# Patient Record
Sex: Male | Born: 1985 | Race: Black or African American | Hispanic: No | Marital: Married | Smoking: Never smoker
Health system: Southern US, Community
[De-identification: ages and names within clinical notes are randomized; demographics above are authoritative.]

## PROBLEM LIST (undated history)

## (undated) DIAGNOSIS — G43909 Migraine, unspecified, not intractable, without status migrainosus: Secondary | ICD-10-CM

## (undated) DIAGNOSIS — M5030 Other cervical disc degeneration, unspecified cervical region: Secondary | ICD-10-CM

## (undated) DIAGNOSIS — B009 Herpesviral infection, unspecified: Secondary | ICD-10-CM

## (undated) HISTORY — DX: Other cervical disc degeneration, unspecified cervical region: M50.30

## (undated) HISTORY — DX: Herpesviral infection, unspecified: B00.9

## (undated) HISTORY — DX: Migraine, unspecified, not intractable, without status migrainosus: G43.909

## (undated) HISTORY — PX: NO PAST SURGERIES: SHX2092

---

## 2006-08-20 ENCOUNTER — Emergency Department (HOSPITAL_COMMUNITY): Admission: EM | Admit: 2006-08-20 | Discharge: 2006-08-20 | Payer: Self-pay | Admitting: Emergency Medicine

## 2006-08-28 ENCOUNTER — Emergency Department (HOSPITAL_COMMUNITY): Admission: EM | Admit: 2006-08-28 | Discharge: 2006-08-28 | Payer: Self-pay | Admitting: Emergency Medicine

## 2012-09-25 ENCOUNTER — Emergency Department (HOSPITAL_COMMUNITY)
Admission: EM | Admit: 2012-09-25 | Discharge: 2012-09-25 | Disposition: A | Discharge status: Home / Self Care | Payer: BC Managed Care – PPO | Attending: Emergency Medicine | Admitting: Emergency Medicine

## 2012-09-25 ENCOUNTER — Encounter (HOSPITAL_COMMUNITY): Payer: Self-pay

## 2012-09-25 ENCOUNTER — Emergency Department (HOSPITAL_COMMUNITY): Payer: BC Managed Care – PPO

## 2012-09-25 DIAGNOSIS — Y9301 Activity, walking, marching and hiking: Secondary | ICD-10-CM | POA: Insufficient documentation

## 2012-09-25 DIAGNOSIS — S82899A Other fracture of unspecified lower leg, initial encounter for closed fracture: Secondary | ICD-10-CM | POA: Insufficient documentation

## 2012-09-25 DIAGNOSIS — Y9289 Other specified places as the place of occurrence of the external cause: Secondary | ICD-10-CM | POA: Insufficient documentation

## 2012-09-25 DIAGNOSIS — X500XXA Overexertion from strenuous movement or load, initial encounter: Secondary | ICD-10-CM | POA: Insufficient documentation

## 2012-09-25 DIAGNOSIS — S82892A Other fracture of left lower leg, initial encounter for closed fracture: Secondary | ICD-10-CM

## 2012-09-25 MED ORDER — MELOXICAM 15 MG PO TABS: 15.0000 mg | ORAL_TABLET | Freq: Every day | ORAL | Status: DC

## 2012-09-25 NOTE — ED Notes (Signed)
Pt c/o lt ankle pain/swelling, states rolled it when walking out the door at 12:55 today

## 2012-09-25 NOTE — ED Provider Notes (Signed)
CSN: [id number]     Arrival date & time 09/25/12  1605 History  This chart was scribed for non-physician practitioner Arthor Captain, PA-C, working with Ross Marcus, MD by Dorothey Baseman, ED Scribe. This patient was seen in room WTR6/WTR6 and the patient's care was started at 5:29 PM.    Chief Complaint  Patient presents with  . Ankle Pain   Patient is a 27 y.o. male presenting with ankle pain. The history is provided by the patient. No language interpreter was used.  Ankle Pain Location:  Ankle Ankle location:  L ankle Pain details:    Radiates to:  Does not radiate   Severity:  Moderate   Onset quality:  Sudden   Timing:  Constant   Progression:  Improving Chronicity:  New Prior injury to area:  No Relieved by:  NSAIDs and ice Worsened by:  Bearing weight Associated symptoms: decreased ROM and swelling    HPI Comments: Adam Colon is a 27 y.o. male who presents to the Emergency Department complaining of constant left ankle pain, 3-4/10 currently that has improved slightly since the incident, with associated swelling onset 4.5 hours ago when he states that he rolled the ankle when walking out of his house. He reports a limited range of motion due to pain and that the pain is exacerbated with walking and bearing weight. He states that he has taken ibuprofen at home and applied ice with mild, temporary relief. He reports a history of similar injury to the right ankle, but denies any prior injury to the left ankle. He denies knee pain.   History reviewed. No pertinent past medical history. History reviewed. No pertinent past surgical history. No family history on file. History  Substance Use Topics  . Smoking status: Never Smoker   . Smokeless tobacco: Not on file  . Alcohol Use: No    Review of Systems  Musculoskeletal: Positive for joint swelling and gait problem.  Skin: Negative for wound.  Neurological: Negative for weakness and numbness.  Hematological: Does not  bruise/bleed easily.  All other systems reviewed and are negative.    Allergies  Review of patient's allergies indicates no known allergies.  Home Medications   Current Outpatient Rx  Name  Route  Sig  Dispense  Refill  . ibuprofen (ADVIL,MOTRIN) 200 MG tablet   Oral   Take 200 mg by mouth every 6 (six) hours as needed for pain.          Triage Vitals: BP 117/76  Pulse 71  Temp(Src) 99.1 F (37.3 C) (Oral)  Resp 18  SpO2 100%  Physical Exam  Nursing note and vitals reviewed. Constitutional: He is oriented to person, place, and time. He appears well-developed and well-nourished. No distress.  HENT:  Head: Normocephalic and atraumatic.  Eyes: Conjunctivae are normal.  Neck: Normal range of motion. Neck supple.  Musculoskeletal: Normal range of motion. He exhibits edema and tenderness.  Significant swelling to the left, lateral malleolus.   Neurological: He is alert and oriented to person, place, and time.  Skin: Skin is warm and dry.  Psychiatric: He has a normal mood and affect. His behavior is normal.    ED Course  Procedures (including critical care time)  DIAGNOSTIC STUDIES: Oxygen Saturation is 100% on room air, normal by my interpretation.    COORDINATION OF CARE: 5:35PM- Will order an x-ray of the left ankle. Discussed treatment plan with patient at bedside and patient verbalized agreement.     Labs Review Labs Reviewed -  No data to display Imaging Review Dg Ankle Complete Left  09/25/2012   CLINICAL DATA:  Twisted ankle today. Pain, swelling.  EXAM: LEFT ANKLE COMPLETE - 3+ VIEW  COMPARISON:  None.  FINDINGS: There is significant lateral soft tissue swelling. Small bone densities are seen adjacent to the medial and lateral malleolar 1. Small bone density is identified posterior to the joint space. Large joint effusion is present.  IMPRESSION: 1. Small avulsion fractures adjacent to medial, lateral, and possibly posterior malleoli. 2. Significant soft  tissue swelling and joint effusion.   Electronically Signed   By: Rosalie Gums M.D.   On: 09/25/2012 18:13    MDM   1. Avulsion fracture of ankle, left, closed, initial encounter   Patient with very small avulsion fractures of the malleoli BL. Joint feels stable on exam. The patient will be discharged with Cam walker, crutches and ortho follow up. Supportive care and RICE encouraged,  I personally performed the services described in this documentation, which was scribed in my presence. The recorded information has been reviewed and is accurate.     Arthor Captain, PA-C 09/27/12 1745

## 2012-09-27 NOTE — ED Provider Notes (Signed)
Medical screening examination/treatment/procedure(s) were performed by non-physician practitioner and as supervising physician I was immediately available for consultation/collaboration.  Shon Baton, MD 09/27/12 [phone]

## 2013-12-19 ENCOUNTER — Encounter (HOSPITAL_COMMUNITY): Payer: Self-pay | Admitting: Emergency Medicine

## 2013-12-19 ENCOUNTER — Emergency Department (HOSPITAL_COMMUNITY)
Admission: EM | Admit: 2013-12-19 | Discharge: 2013-12-19 | Disposition: A | Discharge status: Home / Self Care | Payer: Medicaid Other | Attending: Emergency Medicine | Admitting: Emergency Medicine

## 2013-12-19 DIAGNOSIS — Z791 Long term (current) use of non-steroidal anti-inflammatories (NSAID): Secondary | ICD-10-CM | POA: Diagnosis not present

## 2013-12-19 DIAGNOSIS — J029 Acute pharyngitis, unspecified: Secondary | ICD-10-CM | POA: Insufficient documentation

## 2013-12-19 LAB — RAPID STREP SCREEN (MED CTR MEBANE ONLY): STREPTOCOCCUS, GROUP A SCREEN (DIRECT): NEGATIVE

## 2013-12-19 MED ORDER — IBUPROFEN 600 MG PO TABS: 600.0000 mg | ORAL_TABLET | Freq: Four times a day (QID) | ORAL | Status: DC | PRN

## 2013-12-19 MED ORDER — LORATADINE 10 MG PO TABS: 10.0000 mg | ORAL_TABLET | Freq: Every day | ORAL | Status: DC

## 2013-12-19 NOTE — ED Provider Notes (Signed)
CSN: [id number]     Arrival date & time 12/19/13  0929 History  This chart was scribed for non-physician practitioner, Jinny SandersJoseph Makenzey Nanni, PA-C, working with Lyanne CoKevin M Campos, MD by Charline BillsEssence Howell, ED Scribe. This patient was seen in room TR06C/TR06C and the patient's care was started at 9:39 AM.   Chief Complaint  Patient presents with  . Sore Throat   The history is provided by the patient. No language interpreter was used.   HPI Comments: Adam Colon is a 28 y.o. male who presents to the Emergency Department complaining of mild sore throat upon waking this morning. He currently rates his pain 2/10. Pt reports associated HA, fever, itchy throat, fatigue onset this morning. Tmax 100.4 F, ED temperature 98.6 F. He denies cough. Pt reports that his coworker was recently diagnosed with strep throat. Pt has been treating with ibuprofen with relief of his symptoms.   History reviewed. No pertinent past medical history. History reviewed. No pertinent past surgical history. No family history on file. History  Substance Use Topics  . Smoking status: Never Smoker   . Smokeless tobacco: Not on file  . Alcohol Use: No    Review of Systems  Constitutional: Positive for fever and fatigue.  HENT: Positive for sore throat.   Respiratory: Negative for cough.   Neurological: Positive for headaches.   Allergies  Review of patient's allergies indicates no known allergies.  Home Medications   Prior to Admission medications   Medication Sig Start Date End Date Taking? Authorizing Provider  ibuprofen (ADVIL,MOTRIN) 600 MG tablet Take 1 tablet (600 mg total) by mouth every 6 (six) hours as needed. 12/19/13   Monte FantasiaJoseph W Latangela Mccomas, PA-C  loratadine (CLARITIN) 10 MG tablet Take 1 tablet (10 mg total) by mouth daily. One po daily x 5 days 12/19/13   Monte FantasiaJoseph W Emarie Paul, PA-C  meloxicam (MOBIC) 15 MG tablet Take 1 tablet (15 mg total) by mouth daily. 09/25/12   Arthor CaptainAbigail Harris, PA-C   Triage Vitals: BP 136/70 mmHg  Pulse 90   Temp(Src) 98.6 F (37 C) (Oral)  Resp 18  Ht 6\' 1"  (1.854 m)  Wt 192 lb (87.091 kg)  BMI 25.34 kg/m2  SpO2 97% Physical Exam  Constitutional: He is oriented to person, place, and time. He appears well-developed and well-nourished. No distress.  HENT:  Head: Normocephalic and atraumatic.  Mouth/Throat: Uvula is midline and mucous membranes are normal. No trismus in the jaw. No dental abscesses or uvula swelling. Posterior oropharyngeal erythema (mild) present. No oropharyngeal exudate, posterior oropharyngeal edema or tonsillar abscesses.  No tonsillar swelling. Tonsils non-enlarged bilaterally.   Eyes: Conjunctivae and EOM are normal.  Neck: Neck supple. No tracheal deviation present.  Cardiovascular: Normal rate.   Pulmonary/Chest: Effort normal. No respiratory distress.  Musculoskeletal: Normal range of motion.  Lymphadenopathy:    He has no cervical adenopathy.  Neurological: He is alert and oriented to person, place, and time.  Skin: Skin is warm and dry.  Psychiatric: He has a normal mood and affect. His behavior is normal.  Nursing note and vitals reviewed.  ED Course  Procedures (including critical care time) DIAGNOSTIC STUDIES: Oxygen Saturation is 97% on RA, normal by my interpretation.    COORDINATION OF CARE: 9:42 AM-Discussed treatment plan which includes strep screen and culture with pt at bedside and pt agreed to plan.   Labs Review Labs Reviewed  RAPID STREP SCREEN  CULTURE, GROUP A STREP   Imaging Review No results found.   EKG Interpretation None  MDM   Final diagnoses:  Viral pharyngitis    Pt afebrile without tonsillar exudate, negative strep. Presents with 1 day of generalized malaise, low-grade fever, mild headache. Symptoms relieved with ibuprofen. Patient stating he has had similar signs and symptoms with strep throat in the past, also had a recent sick contact of a coworker who had positive strep test. Patient without cervical  lymphadenopathy, or dysphagia. Patient with a "itchy throat"; diagnosis of viral pharyngitis. No abx indicated. DC w symptomatic tx for pain  Pt does not appear dehydrated, but did discuss importance of water rehydration. Presentation non concerning for PTA or infxn spread to soft tissue. No trismus or uvula deviation. No neck stiffness or meningeal signs. No [facility] or [facility]. Specific return precautions discussed. Pt able to drink water in ED without difficulty with intact air way. Recommended PCP follow up. Provided resource guide to help patient find a PCP. Patient agreeable to this plan. I encouraged patient to call or return to the ER should he have any questions or concerns.  I personally performed the services described in this documentation, which was scribed in my presence. The recorded information has been reviewed and is accurate.  BP 136/70 mmHg  Pulse 90  Temp(Src) 98.6 F (37 C) (Oral)  Resp 18  Ht 6\' 1"  (1.854 m)  Wt 192 lb (87.091 kg)  BMI 25.34 kg/m2  SpO2 97%  Signed,  Ladona MowJoe Derrian Rodak, PA-C 5:11 PM    Monte FantasiaJoseph W Lulubelle Simcoe, PA-C 12/19/13 1711  Lyanne CoKevin M Campos, MD 12/20/13 [phone]

## 2013-12-19 NOTE — ED Notes (Signed)
Patient states he was exposed to strep throat by co-worker and a child where he works also.   Patient states "I had it before and now I have similar symptoms".  Patient states took ibuprofen this morning for pain 400mg .

## 2013-12-19 NOTE — Discharge Instructions (Signed)
Pharyngitis °Pharyngitis is redness, pain, and swelling (inflammation) of your pharynx.  °CAUSES  °Pharyngitis is usually caused by infection. Most of the time, these infections are from viruses (viral) and are part of a cold. However, sometimes pharyngitis is caused by bacteria (bacterial). Pharyngitis can also be caused by allergies. Viral pharyngitis may be spread from person to person by coughing, sneezing, and personal items or utensils (cups, forks, spoons, toothbrushes). Bacterial pharyngitis may be spread from person to person by more intimate contact, such as kissing.  °SIGNS AND SYMPTOMS  °Symptoms of pharyngitis include:   °· Sore throat.   °· Tiredness (fatigue).   °· Low-grade fever.   °· Headache. °· Joint pain and muscle aches. °· Skin rashes. °· Swollen lymph nodes. °· Plaque-like film on throat or tonsils (often seen with bacterial pharyngitis). °DIAGNOSIS  °Your health care provider will ask you questions about your illness and your symptoms. Your medical history, along with a physical exam, is often all that is needed to diagnose pharyngitis. Sometimes, a rapid strep test is done. Other lab tests may also be done, depending on the suspected cause.  °TREATMENT  °Viral pharyngitis will usually get better in 3-4 days without the use of medicine. Bacterial pharyngitis is treated with medicines that kill germs (antibiotics).  °HOME CARE INSTRUCTIONS  °· Drink enough water and fluids to keep your urine clear or pale yellow.   °· Only take over-the-counter or prescription medicines as directed by your health care provider:   °¨ If you are prescribed antibiotics, make sure you finish them even if you start to feel better.   °¨ Do not take aspirin.   °· Get lots of rest.   °· Gargle with 8 oz of salt water (½ tsp of salt per 1 qt of water) as often as every 1-2 hours to soothe your throat.   °· Throat lozenges (if you are not at risk for choking) or sprays may be used to soothe your throat. °SEEK MEDICAL  CARE IF:  °· You have large, tender lumps in your neck. °· You have a rash. °· You cough up green, yellow-brown, or bloody spit. °SEEK IMMEDIATE MEDICAL CARE IF:  °· Your neck becomes stiff. °· You drool or are unable to swallow liquids. °· You vomit or are unable to keep medicines or liquids down. °· You have severe pain that does not go away with the use of recommended medicines. °· You have trouble breathing (not caused by a stuffy nose). °MAKE SURE YOU:  °· Understand these instructions. °· Will watch your condition. °· Will get help right away if you are not doing well or get worse. °Document Released: 12/21/2004 Document Revised: 10/11/2012 Document Reviewed: 08/28/2012 °ExitCare® Patient Information ©2015 [employer]. This information is not intended to replace advice given to you by your health care provider. Make sure you discuss any questions you have with your health care provider. ° ° °Emergency Department Resource Guide °1) Find a Doctor and Pay Out of Pocket °Although you won't have to find out who is covered by your insurance plan, it is a good idea to ask around and get recommendations. You will then need to call the office and see if the doctor you have chosen will accept you as a new patient and what types of options they offer for patients who are self-pay. Some doctors offer discounts or will set up payment plans for their patients who do not have insurance, but you will need to ask so you aren't surprised when you get to your appointment. ° °  2) Contact Your Local Health Department °Not all health departments have doctors that can see patients for sick visits, but many do, so it is worth a call to see if yours does. If you don't know where your local health department is, you can check in your phone book. The CDC also has a tool to help you locate your state's health department, and many state websites also have listings of all of their local health departments. ° °3) Find a Walk-in Clinic °If  your illness is not likely to be very severe or complicated, you may want to try a walk in clinic. These are popping up all over the country in pharmacies, drugstores, and shopping centers. They're usually staffed by nurse practitioners or physician assistants that have been trained to treat common illnesses and complaints. They're usually fairly quick and inexpensive. However, if you have serious medical issues or chronic medical problems, these are probably not your best option. ° °No Primary Care Doctor: °- Call [facility] at  832-8000 - they can help you locate a primary care doctor that  accepts your insurance, provides certain services, etc. °- Physician Referral Service- 1-800-533-3463 ° °Chronic Pain Problems: °Organization         Address  Phone   Notes  °[facility]  (336) 297-2271 Patients need to be referred by their primary care doctor.  ° °Medication Assistance: °Organization         Address  Phone   Notes  °[place] [facility] [address] °[place], [place] [address] (336) 641-8030 --Must be a resident of [place] °-- Must have NO insurance coverage whatsoever (no Medicaid/ Medicare, etc.) °-- The pt. MUST have a primary care doctor that directs their care regularly and follows them in the community °  °[facility]  (866) 331-1348   °[employer]  (888) 892-1162   ° °Agencies that provide inexpensive medical care: °Organization         Address  Phone   Notes  °[facility]  (336) 832-8035   °[facility]    (336) 832-7272   °[facility] [address] °[place], [place] [address] (336) 832-4777   °[facility] [address], °[place] (336) 271-4999   °[facility]    (336) 373-0678   °[facility]    (336) 272-1050   °[facility] ° [address], [place] Phone:  (336) 832-4444, Fax:  (336) 832-4440 Hours of  Operation:  9 am - 6 pm, M-F.  Also accepts Medicaid/Medicare and self-pay.  °[facility] ° [address], [place] Phone: (336) 832-3150, Fax: (336) 832-3151. Hours of Operation:  8:30 am - 5:30 pm, M-F.  Also accepts Medicaid and self-pay.  °[facility] [place] [address], [place] Phone: (336) 878-6027   °[facility] [address], [place],  (336)723-1848, Ext. 123 Mondays & Thursdays: 7-9 AM.  First 15 patients are seen on a first come, first serve basis. °  ° °Medicaid-accepting [place] Providers: ° °Organization         Address  Phone   Notes  °[facility] [address],  (336) 641-2100 Also accepts self-pay patients.  °[facility] [address],  ° (336) 856-9996   °[facility] [address], [place] (336) 288-8857   °[facility] [address] [place] [address], [place] (336) 299-7000   °Veita Bland [address], [place]  ° (336) 373-1557 Only accepts [place] Access Medicaid patients after they have their name applied to their card.  ° °Self-Pay (no insurance) in [place]: ° °Organization         Address  Phone   Notes  °Sickle Cell Patients, [facility] [address], [place] (336) 832-1970   °[facility] [address], [place] (336) 832-4400   °[facility] ° [address], [place] (336) 992-4800   °[facility]/Dr. Osei-Bonsu ° [address] [place] [address], [place] or [address], [place] (336) 841-8500 Phone number for both [place] and [place] locations is the same.  °[facility] [address], [place] (336) 299-0000   °[facility] [address] [place] [address], [place] or [address] (336) 852-7530 °(336) 878-2260   °[facility] [address], [place] (336) 350-1642, phone; (336) 294-5005, fax Sees patients 1st and 3rd Saturday of every month.  Must not qualify for public or private insurance (i.e. Medicaid, Medicare, [place] Health Choice, Veterans' Benefits) • Household income should be no more than 200% of the poverty level •The clinic cannot treat you if you are pregnant or think you are pregnant • Sexually transmitted diseases are not treated at the clinic.  ° ° °Dental Care: °Organization         Address  Phone  Notes  °[facility] [address], [place] (336) 641-6152 Accepts children up to age 21 who are enrolled in Medicaid or [place] Health Choice; pregnant women with a Medicaid card; and children who have applied for Medicaid or [place] Health Choice, but were declined, whose parents can pay a reduced fee at time of service.  °[facility] [place]  [address], [place] (336) 641-7733 Accepts children up to age 21 who are enrolled in Medicaid or [place] Health Choice; pregnant women with a Medicaid card; and children who have applied for Medicaid or [place] Health Choice, but were declined, whose parents can pay a reduced fee at time of service.  °[facility] ° [address],  (336) 641-4533 Patients are seen by appointment only. Walk-ins are not accepted. [facility] will see patients 18 years of age and older. °Monday - Tuesday (8am-5pm) °Most Wednesdays (8:30-5pm) °$30 per visit, cash only  °[facility] ° [address], [place] (336) 641-4533 Patients are seen by appointment only. Walk-ins are not accepted. [facility] will see patients 18 years of age and older. °One Wednesday Evening (Monthly: Volunteer Based).  $30 per visit, cash only  °[facility]  (919) [phone] for adults; Children under age 4, call Graduate Pediatric  Dentistry at ([phone]) [phone]. Children aged 4-14, please call (919) [phone] to request a pediatric application. ° Dental services are provided in all areas of dental care including fillings, crowns and bridges, complete and partial dentures, implants, gum treatment, root canals, and extractions. Preventive care is also provided. Treatment is provided to both adults and children. °Patients are selected via a lottery and there is often a waiting list. °  °[facility] [address], °  [place] ° (336) 763-8833 [email or web address] °  °[facility] [address], [place], [place] (336)723-1848, Ext. 123 Second and Fourth Thursday of each month, opens at 6:30 AM; Clinic ends at 9 AM.  Patients are seen on a first-come first-served basis, and a limited number are seen during each clinic.  ° °[facility] ° [address], [place], [place] (336) 723-7904   Eligibility Requirements °You must have lived in [place], [place], or [place] counties for at least the last three months. °  You cannot be eligible for state or federal sponsored healthcare insurance, including Veterans Administration, Medicaid, or Medicare. °  You generally cannot be eligible for healthcare insurance through your employer.  °  How to apply: °Eligibility screenings are held every Tuesday and Wednesday afternoon from 1:00 pm until 4:00 pm. You do not need an appointment for the interview!  °[facility] [address], [place], [place] 336-631-2330   °[facility]  336-342-8273   °[facility]  336-703-3100   °[facility]  336-570-6415   ° °Behavioral Health Resources in the Community: °Intensive Outpatient Programs °Organization         Address  Phone  Notes  °[facility] [address], [place], [place] 336-878-6098   °[facility] [address], [place], [place] 336-832-9800   °[facility] [address], [place], [place] ° 336-882-2125   °[facility] [address],  °[place], [place] 1-800-853-5163 or 336-641-4981   °Substance Abuse Resources °Organization         Address  Phone  Notes  °[facility]  336-882-2125   °[facility]  336-784-9470   °[facility]  336-285-9073   °[facility]  336-845-3988   °Residential & Outpatient Substance Abuse Program  1-800-659-3381   °Psychological Services °Organization         Address  Phone  Notes  °[facility]  336- 832-9600   °[facility]  336- 378-7881   °[facility] [address], [place] 1-800-853-5163 or 336-641-4981   ° °Mobile Crisis Teams °Organization         Address  Phone  Notes  °[facility], Mobile Crisis Care Unit  1-877-626-1772   °Assertive °[facility] ° [address] [place], [place] 336-834-9664   °Sharon DeEsch [address] °[place] 336-554-5454   ° °Self-Help/Support Groups °Organization         Address  Phone             Notes  °[facility] [place] - variety of support groups  336- 373-1402 Call for more information  °Narcotics Anonymous (NA), [facility] [address], °[place]  2 meetings at this location  ° °Residential Treatment Programs °Organization         Address  Phone  Notes  °[facility] [address],    °[place]  1-866-801-8205   °[facility] ° [address], [place], [place] 704-293-8524   °[facility] [address], [place] 336-845-3988 Admissions: 8am-3pm M-F  °[facility] [address]    °[place], [place] 336-841-1104   °[facility] [address], [place], [place] 336-379-7146   °[facility] [address]  °[place], [place] 336-285-9073   °[facility] - Intensive Outpatient [address],  [place], [place] 336-852-3033   °[facility] [address]  °[place], [place] 1-877-615-2722 or 336-784-9470   °[facility] [address] [place], [place] 336-227-7417 Accepts Medicaid  °[facility] [address]  °[place] 1-800-659-3381 Substance Abuse/Addiction Treatment  ° °[place] Behavioral Health Resources °Organization         Address  Phone  Notes  °[facility]  (888) 581-9988   °Julie Brannon, PhD [address] [place], [place]   (336) 349-5553 or (336) 951-0000   °[facility]   [address] °[place], [place] (336) 349-4454   °[facility] [address], [place], [place] (336) 342-8316 Insurance/Medicaid/sponsorship through [facility]  °[facility] [address]                                    [place], [place] (336) 342-8316 Therapy/tele-psych/case  °[facility] [address]  ° [place], [place] (336) 349-2233    °Dr. Arfeen  (336) 349-4544   °[facility] [place]  [employer] [place] [facility] 1) [address],  °2) [address], [place] °3)  [address], [place] (336) 349-3220 °(336) 342-7768 ° °(336) 342-8140   °[place] [facility] (336) 342-1394 or (336) 342-3537 (After Hours)    ° ° ° ° °

## 2013-12-21 LAB — CULTURE, GROUP A STREP

## 2017-06-14 ENCOUNTER — Other Ambulatory Visit (HOSPITAL_COMMUNITY): Payer: Self-pay | Admitting: Nurse Practitioner

## 2017-06-14 ENCOUNTER — Ambulatory Visit
Admission: RE | Admit: 2017-06-14 | Discharge: 2017-06-14 | Disposition: A | Discharge status: Home / Self Care | Payer: Self-pay | Source: Ambulatory Visit | Attending: Nurse Practitioner | Admitting: Nurse Practitioner

## 2017-06-14 ENCOUNTER — Other Ambulatory Visit: Payer: Self-pay | Admitting: Nurse Practitioner

## 2017-06-14 DIAGNOSIS — K2100 Gastro-esophageal reflux disease with esophagitis, without bleeding: Secondary | ICD-10-CM

## 2017-06-30 ENCOUNTER — Ambulatory Visit (HOSPITAL_COMMUNITY): Payer: Self-pay

## 2018-07-18 DIAGNOSIS — H1032 Unspecified acute conjunctivitis, left eye: Secondary | ICD-10-CM | POA: Diagnosis not present

## 2018-09-05 DIAGNOSIS — H1032 Unspecified acute conjunctivitis, left eye: Secondary | ICD-10-CM | POA: Diagnosis not present

## 2018-10-23 DIAGNOSIS — M50323 Other cervical disc degeneration at C6-C7 level: Secondary | ICD-10-CM | POA: Diagnosis not present

## 2018-10-23 DIAGNOSIS — M9901 Segmental and somatic dysfunction of cervical region: Secondary | ICD-10-CM | POA: Diagnosis not present

## 2018-10-23 DIAGNOSIS — M5384 Other specified dorsopathies, thoracic region: Secondary | ICD-10-CM | POA: Diagnosis not present

## 2018-10-23 DIAGNOSIS — M9902 Segmental and somatic dysfunction of thoracic region: Secondary | ICD-10-CM | POA: Diagnosis not present

## 2018-10-26 DIAGNOSIS — M9902 Segmental and somatic dysfunction of thoracic region: Secondary | ICD-10-CM | POA: Diagnosis not present

## 2018-10-26 DIAGNOSIS — M9903 Segmental and somatic dysfunction of lumbar region: Secondary | ICD-10-CM | POA: Diagnosis not present

## 2018-10-26 DIAGNOSIS — M50323 Other cervical disc degeneration at C6-C7 level: Secondary | ICD-10-CM | POA: Diagnosis not present

## 2018-10-26 DIAGNOSIS — M9901 Segmental and somatic dysfunction of cervical region: Secondary | ICD-10-CM | POA: Diagnosis not present

## 2018-11-07 DIAGNOSIS — M9901 Segmental and somatic dysfunction of cervical region: Secondary | ICD-10-CM | POA: Diagnosis not present

## 2018-11-07 DIAGNOSIS — M50323 Other cervical disc degeneration at C6-C7 level: Secondary | ICD-10-CM | POA: Diagnosis not present

## 2018-11-07 DIAGNOSIS — M9902 Segmental and somatic dysfunction of thoracic region: Secondary | ICD-10-CM | POA: Diagnosis not present

## 2018-11-07 DIAGNOSIS — M9903 Segmental and somatic dysfunction of lumbar region: Secondary | ICD-10-CM | POA: Diagnosis not present

## 2018-11-09 DIAGNOSIS — M9901 Segmental and somatic dysfunction of cervical region: Secondary | ICD-10-CM | POA: Diagnosis not present

## 2018-11-09 DIAGNOSIS — M9902 Segmental and somatic dysfunction of thoracic region: Secondary | ICD-10-CM | POA: Diagnosis not present

## 2018-11-09 DIAGNOSIS — M50323 Other cervical disc degeneration at C6-C7 level: Secondary | ICD-10-CM | POA: Diagnosis not present

## 2018-11-09 DIAGNOSIS — M9903 Segmental and somatic dysfunction of lumbar region: Secondary | ICD-10-CM | POA: Diagnosis not present

## 2018-11-13 DIAGNOSIS — M9902 Segmental and somatic dysfunction of thoracic region: Secondary | ICD-10-CM | POA: Diagnosis not present

## 2018-11-13 DIAGNOSIS — M9903 Segmental and somatic dysfunction of lumbar region: Secondary | ICD-10-CM | POA: Diagnosis not present

## 2018-11-13 DIAGNOSIS — M542 Cervicalgia: Secondary | ICD-10-CM | POA: Diagnosis not present

## 2018-11-13 DIAGNOSIS — M50323 Other cervical disc degeneration at C6-C7 level: Secondary | ICD-10-CM | POA: Diagnosis not present

## 2018-11-13 DIAGNOSIS — M9901 Segmental and somatic dysfunction of cervical region: Secondary | ICD-10-CM | POA: Diagnosis not present

## 2018-11-15 DIAGNOSIS — J028 Acute pharyngitis due to other specified organisms: Secondary | ICD-10-CM | POA: Diagnosis not present

## 2018-11-21 DIAGNOSIS — M9901 Segmental and somatic dysfunction of cervical region: Secondary | ICD-10-CM | POA: Diagnosis not present

## 2018-11-21 DIAGNOSIS — M9902 Segmental and somatic dysfunction of thoracic region: Secondary | ICD-10-CM | POA: Diagnosis not present

## 2018-11-21 DIAGNOSIS — M50323 Other cervical disc degeneration at C6-C7 level: Secondary | ICD-10-CM | POA: Diagnosis not present

## 2018-11-21 DIAGNOSIS — M9903 Segmental and somatic dysfunction of lumbar region: Secondary | ICD-10-CM | POA: Diagnosis not present

## 2018-12-26 ENCOUNTER — Encounter: Payer: Self-pay | Admitting: Family Medicine

## 2018-12-26 ENCOUNTER — Other Ambulatory Visit: Payer: Self-pay

## 2018-12-26 ENCOUNTER — Ambulatory Visit: Payer: BC Managed Care – PPO | Admitting: Family Medicine

## 2018-12-26 VITALS — BP 138/88 | HR 91 | Temp 99.5°F | Ht 71.75 in | Wt 201.5 lb

## 2018-12-26 DIAGNOSIS — Z8619 Personal history of other infectious and parasitic diseases: Secondary | ICD-10-CM | POA: Insufficient documentation

## 2018-12-26 DIAGNOSIS — G43709 Chronic migraine without aura, not intractable, without status migrainosus: Secondary | ICD-10-CM

## 2018-12-26 DIAGNOSIS — M542 Cervicalgia: Secondary | ICD-10-CM | POA: Insufficient documentation

## 2018-12-26 DIAGNOSIS — Z8042 Family history of malignant neoplasm of prostate: Secondary | ICD-10-CM | POA: Diagnosis not present

## 2018-12-26 NOTE — Patient Instructions (Addendum)
#  Headaches and Back pain - physical therapy referral - continue taking ibuprofen as needed  #Prostate cancer screening - As we discussed there are risk to getting the PSA -- false positives  And over diagnosing cancer -- would recommend considering getting the blood work more strongly again around age 33 -- but would still discuss  #Blood pressure - We will continue to monitor this - improved on repeat  Let me know if you want to get screened for diabetes, would probably also check your cholesterol if getting blood work. But for cost -- could do whatever you like  Tests: Fasting Glucose, or Hemoglobin a1c, cholesterol

## 2018-12-26 NOTE — Assessment & Plan Note (Signed)
HA ~2x per month. Discussed PT for neck/back/HA management and pt agreed. Already has vision appointment to see if that helps. Cont ibuprofen prn.

## 2018-12-26 NOTE — Assessment & Plan Note (Signed)
Grandfather with prostate cancer x 2, age 32 (approximately). Long discussion about the risks of prostate cancer screening - including false positives (due to bicycle use or intercourse) and over diagnosing cancers that would not lead to long term issues. Also discussed that routine screening does regularly begin until age 17. Could consider earlier testing given grandfather's diagnosis but would not recommend until >40. Pt ultimately agreed to wait on screening. Will reassess at future appointments

## 2018-12-26 NOTE — Progress Notes (Signed)
Subjective:     Adam Colon is a 33 y.o. male presenting for Establish Care (previous PCP Dianna Rossetti with [facility].) and Migraine (hx, having some headaches but better with chiropractor treatments.)     Migraine  This is a chronic problem. The pain is located in the left unilateral and temporal region. Radiates to: otherside of the head. The pain quality is similar to prior headaches. Quality: pressure. The pain is severe. Associated symptoms include phonophobia and photophobia. Pertinent negatives include no blurred vision, coughing, eye watering, nausea, neck pain, rhinorrhea, sinus pressure or vomiting. He has tried NSAIDs for the symptoms. The treatment provided moderate relief. His past medical history is significant for migraine headaches.   Seeing a chiropractor - for back/neck issue  Started a new supplement Sitting in front of a computer every day now - due to [place] Was getting massage therapy and going to the chiropractor - last massage 1 month ago Monitors are on risers Vision checked last year - sensitivity to light more  Already using blue light lenses and has upcoming appointment  Last month: 2 in the last month  #HTN - has been told this been a little high in the past     Review of Systems  HENT: Negative for rhinorrhea and sinus pressure.   Eyes: Positive for photophobia. Negative for blurred vision.  Respiratory: Negative for cough.   Gastrointestinal: Negative for nausea and vomiting.  Musculoskeletal: Negative for neck pain.     Social History   Tobacco Use  Smoking Status Never Smoker  Smokeless Tobacco Never Used        Objective:    BP Readings from Last 3 Encounters:  12/26/18 138/88  12/19/13 136/70  09/25/12 117/76   Wt Readings from Last 3 Encounters:  12/26/18 201 lb 8 oz (91.4 kg)  12/19/13 192 lb (87.1 kg)    BP 138/88   Pulse 91   Temp 99.5 F (37.5 C)   Ht 5' 11.75" (1.822 m)   Wt 201 lb 8 oz (91.4 kg)   SpO2 98%    BMI 27.52 kg/m    Physical Exam Constitutional:      Appearance: Normal appearance. He is not ill-appearing or diaphoretic.  HENT:     Head: Normocephalic and atraumatic.     Right Ear: External ear normal.     Left Ear: External ear normal.  Eyes:     General: No scleral icterus.    Extraocular Movements: Extraocular movements intact.     Conjunctiva/sclera: Conjunctivae normal.  Cardiovascular:     Rate and Rhythm: Normal rate and regular rhythm.     Heart sounds: No murmur.  Pulmonary:     Effort: Pulmonary effort is normal. No respiratory distress.     Breath sounds: Normal breath sounds. No wheezing.  Musculoskeletal:     Cervical back: Neck supple.  Skin:    General: Skin is warm and dry.  Neurological:     Mental Status: He is alert. Mental status is at baseline.  Psychiatric:        Mood and Affect: Mood normal.           Assessment & Plan:   Problem List Items Addressed This Visit      Cardiovascular and Mediastinum   Chronic migraine without aura without status migrainosus, not intractable - Primary    HA ~2x per month. Discussed PT for neck/back/HA management and pt agreed. Already has vision appointment to see if that helps. Cont  ibuprofen prn.       Relevant Orders   Ambulatory referral to Physical Therapy     Other   Neck pain   Relevant Orders   Ambulatory referral to Physical Therapy   Family history of prostate cancer    Grandfather with prostate cancer x 2, age 12 (approximately). Long discussion about the risks of prostate cancer screening - including false positives (due to bicycle use or intercourse) and over diagnosing cancers that would not lead to long term issues. Also discussed that routine screening does regularly begin until age 61. Could consider earlier testing given grandfather's diagnosis but would not recommend until >40. Pt ultimately agreed to wait on screening. Will reassess at future appointments           >45  minutes spent in face to face time with patient, >50% spent in counselling or coordination of care   Return in about 1 year (around 12/26/2019).  Lynnda Child, MD

## 2018-12-28 ENCOUNTER — Ambulatory Visit: Payer: BC Managed Care – PPO | Admitting: Family Medicine

## 2019-01-16 ENCOUNTER — Encounter: Payer: Self-pay | Admitting: Family Medicine

## 2019-01-16 DIAGNOSIS — K219 Gastro-esophageal reflux disease without esophagitis: Secondary | ICD-10-CM | POA: Insufficient documentation

## 2019-02-02 DIAGNOSIS — H52223 Regular astigmatism, bilateral: Secondary | ICD-10-CM | POA: Diagnosis not present

## 2019-02-02 DIAGNOSIS — H5213 Myopia, bilateral: Secondary | ICD-10-CM | POA: Diagnosis not present

## 2019-05-23 DIAGNOSIS — F4320 Adjustment disorder, unspecified: Secondary | ICD-10-CM | POA: Diagnosis not present

## 2019-05-31 DIAGNOSIS — F4320 Adjustment disorder, unspecified: Secondary | ICD-10-CM | POA: Diagnosis not present

## 2019-06-14 DIAGNOSIS — F4320 Adjustment disorder, unspecified: Secondary | ICD-10-CM | POA: Diagnosis not present

## 2019-06-19 DIAGNOSIS — F4320 Adjustment disorder, unspecified: Secondary | ICD-10-CM | POA: Diagnosis not present

## 2019-07-12 DIAGNOSIS — F4320 Adjustment disorder, unspecified: Secondary | ICD-10-CM | POA: Diagnosis not present

## 2019-07-27 DIAGNOSIS — F4320 Adjustment disorder, unspecified: Secondary | ICD-10-CM | POA: Diagnosis not present

## 2019-08-08 DIAGNOSIS — F4320 Adjustment disorder, unspecified: Secondary | ICD-10-CM | POA: Diagnosis not present

## 2019-08-15 DIAGNOSIS — F4320 Adjustment disorder, unspecified: Secondary | ICD-10-CM | POA: Diagnosis not present

## 2019-08-22 DIAGNOSIS — F4320 Adjustment disorder, unspecified: Secondary | ICD-10-CM | POA: Diagnosis not present

## 2019-08-29 ENCOUNTER — Encounter (HOSPITAL_COMMUNITY): Payer: Self-pay | Admitting: Emergency Medicine

## 2019-08-29 ENCOUNTER — Other Ambulatory Visit: Payer: Self-pay

## 2019-08-29 ENCOUNTER — Ambulatory Visit (HOSPITAL_COMMUNITY)
Admission: EM | Admit: 2019-08-29 | Discharge: 2019-08-29 | Disposition: A | Discharge status: Home / Self Care | Payer: BC Managed Care – PPO | Attending: Urgent Care | Admitting: Urgent Care

## 2019-08-29 DIAGNOSIS — R6883 Chills (without fever): Secondary | ICD-10-CM | POA: Diagnosis not present

## 2019-08-29 DIAGNOSIS — Z20822 Contact with and (suspected) exposure to covid-19: Secondary | ICD-10-CM | POA: Diagnosis not present

## 2019-08-29 DIAGNOSIS — R519 Headache, unspecified: Secondary | ICD-10-CM | POA: Diagnosis not present

## 2019-08-29 DIAGNOSIS — R0989 Other specified symptoms and signs involving the circulatory and respiratory systems: Secondary | ICD-10-CM | POA: Diagnosis not present

## 2019-08-29 DIAGNOSIS — U071 COVID-19: Secondary | ICD-10-CM | POA: Diagnosis not present

## 2019-08-29 LAB — SARS CORONAVIRUS 2 (TAT 6-24 HRS): SARS Coronavirus 2: POSITIVE — AB

## 2019-08-29 NOTE — ED Provider Notes (Signed)
[facility]   MRN: [id number] DOB: 24-May-1985  Subjective:   Juanmanuel Marohl is a 34 y.o. male presenting for 6-day history of progressively improving malaise.  Symptoms started out with fever, chills, headache and chest congestion.  In the past couple of days he has improved dramatically.  However his wife became very ill yesterday, got tested for COVID-19 and was positive.  He has not been vaccinated for COVID-19.  No current facility-administered medications for this encounter.  Current Outpatient Medications:  .  Ascorbic Acid (VITAMIN C) 100 MG tablet, Take 100 mg by mouth daily., Disp: , Rfl:  .  Cyanocobalamin (B-12) 2500 MCG TABS, Take by mouth., Disp: , Rfl:  .  esomeprazole (NEXIUM) 40 MG capsule, Take 40 mg by mouth daily at 12 noon., Disp: , Rfl:  .  Multiple Vitamins-Minerals (CENTRUM SILVER PO), Take by mouth., Disp: , Rfl:  .  OVER THE COUNTER MEDICATION, Neuro-peak, Disp: , Rfl:  .  Probiotic Product (PROBIOTIC-10 PO), Take by mouth., Disp: , Rfl:  .  valACYclovir (VALTREX) 500 MG tablet, Take 500 mg by mouth 2 (two) times daily as needed., Disp: , Rfl:    No Known Allergies  Past Medical History:  Diagnosis Date  . DDD (degenerative disc disease), cervical   . HSV-2 infection   . Migraine      Past Surgical History:  Procedure Laterality Date  . NO PAST SURGERIES      Family History  Problem Relation Age of Onset  . Depression Mother   . Anxiety disorder Mother   . ADD / ADHD Father   . Bipolar disorder Father   . Heart murmur Father   . Other Father        TBI, myofasial pain syndrome, trigeminal neuralgia  . Learning disabilities Sister   . Learning disabilities Brother   . Epilepsy Maternal Grandmother   . Prostate cancer Paternal Grandfather 48  . Diabetes type I Cousin     Social History   Tobacco Use  . Smoking status: Never Smoker  . Smokeless tobacco: Never Used  Vaping Use  . Vaping Use: Never used  Substance Use Topics  .  Alcohol use: Yes    Comment: 1 glass of wine daily  . Drug use: No    ROS   Objective:   Vitals: BP 125/87 (BP Location: Right Arm)   Pulse 82   Temp 98.5 F (36.9 C) (Oral)   Resp 18   SpO2 100%   Physical Exam Constitutional:      General: He is not in acute distress.    Appearance: Normal appearance. He is well-developed and normal weight. He is not ill-appearing, toxic-appearing or diaphoretic.  HENT:     Head: Normocephalic and atraumatic.     Right Ear: External ear normal.     Left Ear: External ear normal.     Nose: Nose normal.     Mouth/Throat:     Pharynx: Oropharynx is clear.  Eyes:     General: No scleral icterus.       Right eye: No discharge.        Left eye: No discharge.     Extraocular Movements: Extraocular movements intact.     Pupils: Pupils are equal, round, and reactive to light.  Cardiovascular:     Rate and Rhythm: Normal rate.  Pulmonary:     Effort: Pulmonary effort is normal.  Musculoskeletal:     Cervical back: Normal range of motion.  Neurological:  Mental Status: He is alert and oriented to person, place, and time.  Psychiatric:        Mood and Affect: Mood normal.        Behavior: Behavior normal.        Thought Content: Thought content normal.        Judgment: Judgment normal.     Assessment and Plan :   PDMP not reviewed this encounter.  1. Clinical diagnosis of COVID-19   2. Close exposure to COVID-19 virus   3. Chest congestion   4. Chills   5. Generalized headache     High suspicion for COVID-19 given that his wife is positive and he actually started out with symptoms and has not been vaccinated.  Counseled that I will do a clinical diagnosis of COVID-19.  COVID-19 testing is pending.  Recommend supportive care. Counseled patient on potential for adverse effects with medications prescribed/recommended today, ER and return-to-clinic precautions discussed, patient verbalized understanding.    Wallis Bamberg,  PA-C 08/29/19 1204

## 2019-08-29 NOTE — ED Triage Notes (Signed)
Pt presents to [facility] for assessment of fever, chills, headache, and "chest congestion".  Patient states his wife tested positive for COVID yesterday.  Wanting testing

## 2019-08-29 NOTE — Discharge Instructions (Signed)
We will manage this as a viral syndrome. For sore throat or cough try using a honey-based tea. Use 3 teaspoons of honey with juice squeezed from half lemon. Place shaved pieces of ginger into 1/2-1 cup of water and warm over stove top. Then mix the ingredients and repeat every 4 hours as needed. Please take Tylenol 500mg-650mg once every 6 hours for fevers, aches and pains. Hydrate very well with at least 2 liters (64 ounces) of water. Eat light meals such as soups (chicken and noodles, chicken wild rice, vegetable).  Do not eat any foods that you are allergic to.  Start an antihistamine like Zyrtec, Allegra or Claritin for postnasal drainage, sinus congestion.  You can take this together with pseudoephedrine (Sudafed) at a dose of 60 mg 3 times a day or 120 mg twice daily as needed for the same kind of congestion.  However, limit your use of pseudoephedrine if you have high blood pressure or avoid altogether if you have abnormal heart rhythms, heart condition. 

## 2019-09-05 DIAGNOSIS — F4320 Adjustment disorder, unspecified: Secondary | ICD-10-CM | POA: Diagnosis not present

## 2019-09-13 DIAGNOSIS — F4320 Adjustment disorder, unspecified: Secondary | ICD-10-CM | POA: Diagnosis not present

## 2019-09-19 DIAGNOSIS — F4320 Adjustment disorder, unspecified: Secondary | ICD-10-CM | POA: Diagnosis not present

## 2019-09-27 DIAGNOSIS — F4320 Adjustment disorder, unspecified: Secondary | ICD-10-CM | POA: Diagnosis not present

## 2019-10-04 DIAGNOSIS — F4320 Adjustment disorder, unspecified: Secondary | ICD-10-CM | POA: Diagnosis not present

## 2019-10-10 DIAGNOSIS — F4320 Adjustment disorder, unspecified: Secondary | ICD-10-CM | POA: Diagnosis not present

## 2019-10-17 DIAGNOSIS — F4320 Adjustment disorder, unspecified: Secondary | ICD-10-CM | POA: Diagnosis not present

## 2019-10-24 DIAGNOSIS — F4320 Adjustment disorder, unspecified: Secondary | ICD-10-CM | POA: Diagnosis not present

## 2019-11-01 DIAGNOSIS — F4320 Adjustment disorder, unspecified: Secondary | ICD-10-CM | POA: Diagnosis not present

## 2019-11-07 DIAGNOSIS — F4320 Adjustment disorder, unspecified: Secondary | ICD-10-CM | POA: Diagnosis not present

## 2019-11-14 DIAGNOSIS — F4320 Adjustment disorder, unspecified: Secondary | ICD-10-CM | POA: Diagnosis not present

## 2019-11-19 DIAGNOSIS — F4320 Adjustment disorder, unspecified: Secondary | ICD-10-CM | POA: Diagnosis not present

## 2019-12-06 DIAGNOSIS — F4320 Adjustment disorder, unspecified: Secondary | ICD-10-CM | POA: Diagnosis not present

## 2019-12-24 DIAGNOSIS — F4320 Adjustment disorder, unspecified: Secondary | ICD-10-CM | POA: Diagnosis not present

## 2020-01-10 DIAGNOSIS — F4323 Adjustment disorder with mixed anxiety and depressed mood: Secondary | ICD-10-CM | POA: Diagnosis not present

## 2020-01-16 DIAGNOSIS — F4320 Adjustment disorder, unspecified: Secondary | ICD-10-CM | POA: Diagnosis not present

## 2020-01-23 DIAGNOSIS — F4323 Adjustment disorder with mixed anxiety and depressed mood: Secondary | ICD-10-CM | POA: Diagnosis not present

## 2020-04-30 ENCOUNTER — Encounter: Payer: Self-pay | Admitting: Emergency Medicine

## 2020-04-30 ENCOUNTER — Other Ambulatory Visit: Payer: Self-pay

## 2020-04-30 ENCOUNTER — Emergency Department
Admission: EM | Admit: 2020-04-30 | Discharge: 2020-04-30 | Disposition: A | Discharge status: Home / Self Care | Payer: Managed Care, Other (non HMO) | Attending: Emergency Medicine | Admitting: Emergency Medicine

## 2020-04-30 ENCOUNTER — Telehealth: Payer: Self-pay

## 2020-04-30 ENCOUNTER — Emergency Department: Payer: Managed Care, Other (non HMO)

## 2020-04-30 DIAGNOSIS — H538 Other visual disturbances: Secondary | ICD-10-CM | POA: Diagnosis not present

## 2020-04-30 DIAGNOSIS — R42 Dizziness and giddiness: Secondary | ICD-10-CM | POA: Diagnosis not present

## 2020-04-30 DIAGNOSIS — R519 Headache, unspecified: Secondary | ICD-10-CM | POA: Insufficient documentation

## 2020-04-30 DIAGNOSIS — R200 Anesthesia of skin: Secondary | ICD-10-CM | POA: Diagnosis present

## 2020-04-30 DIAGNOSIS — R202 Paresthesia of skin: Secondary | ICD-10-CM | POA: Insufficient documentation

## 2020-04-30 DIAGNOSIS — F419 Anxiety disorder, unspecified: Secondary | ICD-10-CM | POA: Diagnosis not present

## 2020-04-30 LAB — COMPREHENSIVE METABOLIC PANEL WITH GFR
ALT: 36 U/L (ref 0–44)
AST: 38 U/L (ref 15–41)
Albumin: 4.7 g/dL (ref 3.5–5.0)
Alkaline Phosphatase: 46 U/L (ref 38–126)
Anion gap: 9 (ref 5–15)
BUN: 16 mg/dL (ref 6–20)
CO2: 25 mmol/L (ref 22–32)
Calcium: 9.6 mg/dL (ref 8.9–10.3)
Chloride: 103 mmol/L (ref 98–111)
Creatinine, Ser: 1.09 mg/dL (ref 0.61–1.24)
GFR, Estimated: >60 mL/min (ref >60)
Glucose, Bld: 124 mg/dL — ABNORMAL HIGH (ref 70–99)
Potassium: 4 mmol/L (ref 3.5–5.1)
Sodium: 137 mmol/L (ref 135–145)
Total Bilirubin: 0.5 mg/dL (ref 0.3–1.2)
Total Protein: 7.8 g/dL (ref 6.5–8.1)

## 2020-04-30 LAB — CBC
HCT: 42 % (ref 39.0–52.0)
Hemoglobin: 14 g/dL (ref 13.0–17.0)
MCH: 30.6 pg (ref 26.0–34.0)
MCHC: 33.3 g/dL (ref 30.0–36.0)
MCV: 91.9 fL (ref 80.0–100.0)
Platelets: 274 K/uL (ref 150–400)
RBC: 4.57 MIL/uL (ref 4.22–5.81)
RDW: 12.3 % (ref 11.5–15.5)
WBC: 6.2 K/uL (ref 4.0–10.5)
nRBC: 0 % (ref 0.0–0.2)

## 2020-04-30 LAB — DIFFERENTIAL
Abs Immature Granulocytes: 0.01 K/uL (ref 0.00–0.07)
Basophils Absolute: 0.1 K/uL (ref 0.0–0.1)
Basophils Relative: 1 %
Eosinophils Absolute: 0.1 K/uL (ref 0.0–0.5)
Eosinophils Relative: 2 %
Immature Granulocytes: 0 %
Lymphocytes Relative: 42 %
Lymphs Abs: 2.6 K/uL (ref 0.7–4.0)
Monocytes Absolute: 0.7 K/uL (ref 0.1–1.0)
Monocytes Relative: 12 %
Neutro Abs: 2.7 K/uL (ref 1.7–7.7)
Neutrophils Relative %: 43 %

## 2020-04-30 LAB — APTT: aPTT: 35 s (ref 24–36)

## 2020-04-30 LAB — PROTIME-INR
INR: 1.2 (ref 0.8–1.2)
Prothrombin Time: 14.8 s (ref 11.4–15.2)

## 2020-04-30 MED ORDER — HYDROXYZINE HCL 25 MG PO TABS: 25.0000 mg | ORAL_TABLET | Freq: Four times a day (QID) | ORAL | 0 refills | Status: DC | PRN

## 2020-04-30 NOTE — Telephone Encounter (Signed)
I spoke with pt and on 04/28/20 pt was driving back from [place] (pt drives 100 miles twice a wk. ) pt said he had drank more caffeine than usual and 2 red bulls; pt also was anxious on 05/28/20. Pt had fast heart beat, a h/a and lt side tingling and some numbness primarily in his lt arm but also blurred vision in lt eye. Pt had issues with focusing and being alert and finally pt was so concerned that he pulled over and called his wife because he did not think he should continue to drive. Pt also has had some radiation of pain into neck. Pt has hx with grandfather of stroke and his grandmother had seizures. Today pt is still having h/a. I advised pt with his symptoms he needs to be evaluated and possible testing. Pt said his wife is on her way home now and should be there within 20 - 30 mins. Pt asked how long would have to wait at ED in [place] and [place]; I spoke with [place] at [facility] ED and now approx 6 hr wait. I spoke with [place] at [facility] ED and no one is in waiting room now. Pt is going to [facility] ED when pts wife gets home. Pt appreciative for time to explain and answer questions. Sending note to Dr Selena Batten as PCP as Lorain Childes.

## 2020-04-30 NOTE — ED Provider Notes (Signed)
[facility] Emergency Department Provider Note ____________________________________________   Event Date/Time   First MD Initiated Contact with Patient 04/30/20 1901     (approximate)  I have reviewed the triage vital signs and the nursing notes.  HISTORY  Chief Complaint Numbness   HPI Adam Colon is a 35 y.o. malewho presents to the ED for evaluation of an episode of numbness and tingling.   Chart review indicates history of migrainous headaches. Patient self-reports a history of anxiety not on any medications, for which he regularly sees a therapist. Reports he takes no prescription medications, never cigarette smoker and no other recreational drugs.  No family history that is relevant.  Patient reports an episode of tingling, blurry vision, extreme anxiety, feeling like he is outside of his own body while he was driving on Monday evening, about 48 hours ago.  Patient reports tingling sensation throughout his body, primarily to the left side and reports blurry vision diffusely throughout his visual fields.  Reports panicking and pulling over, having to call his wife to help drive him the rest of the way home from work.  Further reports associated lightheaded presyncopal dizziness without syncope.  Reports aching headache and sweaty palms bilaterally alongside this.  Reports when he got out of the car once his wife drove him home, he felt weak to his bilateral legs.  Denies focal weakness otherwise.  He does report increased psychosocial stressors related to work, their relationship and home life.  Patient denies recent illnesses, fevers.   Patient reports feeling fine now, self reports being explicitly anxious about what happened.  Denies recurrence of the symptoms.  Denies symptoms "ever being that bad" in the past.     Past Medical History:  Diagnosis Date  . DDD (degenerative disc disease), cervical   . HSV-2 infection   . Migraine     Patient  Active Problem List   Diagnosis Date Noted  . Acid reflux 01/16/2019  . Chronic migraine without aura without status migrainosus, not intractable 12/26/2018  . Neck pain 12/26/2018  . Family history of prostate cancer 12/26/2018  . Hx of herpes genitalis 12/26/2018    Past Surgical History:  Procedure Laterality Date  . NO PAST SURGERIES      Prior to Admission medications   Medication Sig Start Date End Date Taking? Authorizing Provider  hydrOXYzine (ATARAX/VISTARIL) 25 MG tablet Take 1 tablet (25 mg total) by mouth every 6 (six) hours as needed for anxiety. 04/30/20  Yes Delton Prairie, MD  Ascorbic Acid (VITAMIN C) 100 MG tablet Take 100 mg by mouth daily.    [provider]  Cyanocobalamin (B-12) 2500 MCG TABS Take by mouth.    [provider]  esomeprazole (NEXIUM) 40 MG capsule Take 40 mg by mouth daily at 12 noon.    [provider]  Multiple Vitamins-Minerals (CENTRUM SILVER PO) Take by mouth.    [provider]  OVER THE COUNTER MEDICATION Neuro-peak    [provider]  Probiotic Product (PROBIOTIC-10 PO) Take by mouth.    [provider]  valACYclovir (VALTREX) 500 MG tablet Take 500 mg by mouth 2 (two) times daily as needed.    [provider]    Allergies Patient has no known allergies.  Family History  Problem Relation Age of Onset  . Depression Mother   . Anxiety disorder Mother   . ADD / ADHD Father   . Bipolar disorder Father   . Heart murmur Father   .  Other Father        TBI, myofasial pain syndrome, trigeminal neuralgia  . Learning disabilities Sister   . Learning disabilities Brother   . Epilepsy Maternal Grandmother   . Prostate cancer Paternal Grandfather 38  . Diabetes type I Cousin     Social History Social History   Tobacco Use  . Smoking status: Never Smoker  . Smokeless tobacco: Never Used  Vaping Use  . Vaping Use: Never used  Substance Use Topics  . Alcohol use: Yes     Comment: 1 glass of wine daily  . Drug use: No    Review of Systems  Constitutional: No fever/chills Eyes: No visual changes. ENT: No sore throat. Cardiovascular: Denies chest pain. Respiratory: Denies shortness of breath. Gastrointestinal: No abdominal pain.  No nausea, no vomiting.  No diarrhea.  No constipation. Genitourinary: Negative for dysuria. Musculoskeletal: Negative for back pain. Skin: Negative for rash. Neurological: Negative for, focal weakness or numbness. Reports paresthesias, headache, dizziness and blurred vision  ____________________________________________   PHYSICAL EXAM:  VITAL SIGNS: Vitals:   04/30/20 1836  BP: (!) 126/91  Pulse: 67  Resp: 18  Temp: 98.5 F (36.9 C)  SpO2: 99%     Constitutional: Alert and oriented. Well appearing and in no acute distress.  Sitting up on the side of the bed.  Pleasant and conversational in full sentences. Ambulatory with normal gait.  Athletic build.  Eyes: Conjunctivae are normal. PERRL. EOMI. Head: Atraumatic. Nose: No congestion/rhinnorhea. Mouth/Throat: Mucous membranes are moist.  Oropharynx non-erythematous. Neck: No stridor. No cervical spine tenderness to palpation. Cardiovascular: Normal rate, regular rhythm. Grossly normal heart sounds.  Good peripheral circulation. Respiratory: Normal respiratory effort.  No retractions. Lungs CTAB. Gastrointestinal: Soft , nondistended, nontender to palpation. No CVA tenderness. Musculoskeletal: No lower extremity tenderness nor edema.  No joint effusions. No signs of acute trauma. Neurologic:  Normal speech and language. No gross focal neurologic deficits are appreciated. No gait instability noted. Cranial nerves II through XII intact 5/5 strength and sensation in all 4 extremities. Skin:  Skin is warm, dry and intact. No rash noted. Psychiatric: Linear thought processes.  Quite anxious.  ____________________________________________   LABS (all labs ordered  are listed, but only abnormal results are displayed)  Labs Reviewed  COMPREHENSIVE METABOLIC PANEL - Abnormal; Notable for the following components:      Result Value   Glucose, Bld 124 (*)    All other components within normal limits  PROTIME-INR  APTT  CBC  DIFFERENTIAL  I-STAT CREATININE, ED  CBG MONITORING, ED   ____________________________________________  12 Lead EKG  Sinus rhythm, rate of 89 bpm.  Normal axis.  Incomplete right bundle and otherwise normal intervals.  No evidence of acute ischemia.  No comparison. ____________________________________________  RADIOLOGY  ED MD interpretation: CT head reviewed by me without evidence of acute intracranial pathology.  Official radiology report(s): CT HEAD WO CONTRAST  Result Date: 04/30/2020 CLINICAL DATA:  Left-sided numbness and tingling EXAM: CT HEAD WITHOUT CONTRAST TECHNIQUE: Contiguous axial images were obtained from the base of the skull through the vertex without intravenous contrast. COMPARISON:  August 20, 2006 FINDINGS: Brain: No evidence of acute infarction, hemorrhage, hydrocephalus, extra-axial collection or mass lesion/mass effect. Vascular: No hyperdense vessel or unexpected calcification. Skull: Normal. Negative for fracture or focal lesion. Sinuses/Orbits: Paranasal sinuses and mastoid air cells are predominantly clear. Other: None IMPRESSION: No acute intracranial findings. Electronically Signed   By: Maudry Mayhew MD   On: 04/30/2020 19:40  ____________________________________________   PROCEDURES and INTERVENTIONS  Procedure(s) performed (including Critical Care):  .1-3 Lead EKG Interpretation Performed by: Delton Prairie, MD Authorized by: Delton Prairie, MD     Interpretation: normal     ECG rate:  70   ECG rate assessment: normal     Rhythm: sinus rhythm     Ectopy: none     Conduction: normal      Medications - No data to display  ____________________________________________   MDM / ED  COURSE   Otherwise healthy 35 year old male presents to the ED 2 days after an episode of paresthesias, dizziness and anxiety most consistent with a panic attack, and without evidence of additional acute pathology, amenable to outpatient management PCP follow-up.  Normal vitals on room air.  Exam reassuring without evidence of any acute derangements.  He is anxious, but otherwise looks well has no evidence of psychiatric emergency.  No evidence of trauma, neurologic or vascular deficits.  Work-up is benign.  Patient is asymptomatic here in the ED.  We discussed methods to manage stress at home, we discussed hydroxyzine as needed for further anxiety, we discussed following up with his PCP and his therapist.  We discussed return precautions for the ED and patient stable for outpatient management.      ____________________________________________   FINAL CLINICAL IMPRESSION(S) / ED DIAGNOSES  Final diagnoses:  Paresthesias  Acute nonintractable headache, unspecified headache type  Anxiety     ED Discharge Orders         Ordered    hydrOXYzine (ATARAX/VISTARIL) 25 MG tablet  Every 6 hours PRN        04/30/20 1954           Porshia Blizzard Katrinka Blazing   Note:  This document was prepared using Conservation officer, historic buildings and may include unintentional dictation errors.   Delton Prairie, MD 04/30/20 [phone]

## 2020-04-30 NOTE — ED Triage Notes (Signed)
Pt to ED via POV, states talked to several triage nurses at PCP office and was referred to ED for "pre-stroke symptoms". Pt states on Monday had episode of numbness and tingling that was isolated to L side. Pt states symptoms have resolved, lasted approx 1.5 hrs that were intermittent. Pt states since Monday has had slight HA since Monday.
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
## 2020-04-30 NOTE — Telephone Encounter (Signed)
One episode of tension in back that radiates to neck and shoulder, headache... tingling in left arm... pt has had increase in stress and anxiety.... last episode was following increased stress episode...  Pt transferred to Access Nurse to be triaged...  Pt wanted me to forward a msg to office manager as well...   Pt states he est care with Dr Selena Batten 12/2018 and he felt like the appt was rushed and was not happy with care received... Pt inquired about transferring care.... Advised no other provider is taking new pt at this time... He would like to talk to you about his concerns.Marland KitchenMarland KitchenMarland Kitchen

## 2020-04-30 NOTE — Discharge Instructions (Signed)
As we discussed, no signs of stroke, heart problems, kidney problems or any further pathology.  Your symptoms were possibly caused by panic attack or a complex migraine.  You are being discharged with a prescription for hydroxyzine medication to use on an as-needed basis for any further anxiety.  If you develop any worsening symptoms such as passing out all the way, weakness that remains that could suggest a stroke, fevers with your symptoms, please return to the ED. Otherwise follow-up with your PCP in the next week.  Use naproxen/Aleve for anti-inflammatory pain relief. Use up to 500mg  every 12 hours. Do not take more frequently than this. Do not use other NSAIDs (ibuprofen, Advil) while taking this medication. It is safe to take Tylenol with this.  Use Tylenol for pain and fevers.  Up to 1000 mg per dose, up to 4 times per day.  Do not take more than 4000 mg of Tylenol/acetaminophen within 24 hours. 

## 2020-05-01 NOTE — Telephone Encounter (Signed)
Per chart review pt went to [facility] ED on 04/30/20. Sending note to Dr Selena Batten and Gillis Santa.

## 2020-05-01 NOTE — Telephone Encounter (Signed)
Noted. CT negative. Appreciate nurse triage.

## 2020-05-01 NOTE — Telephone Encounter (Signed)
[facility] Night - Client TELEPHONE ADVICE RECORD [facility] Patient Name: Adam D Colon Gender: Male DOB: 10/08/1985 Age: 35 Y 2 M 20 D Return Phone Number: [phone] (Primary), [phone] (Secondary) Address: City/ State/ Zip: [place] [address] Client [facility] Night - Client Client Site [facility] - Night Physician Cody, Jessica- MD Contact Type Call Who Is Calling Patient / Member / Family / Caregiver Call Type Triage / Clinical Relationship To Patient Self Return Phone Number [phone] (Primary) Chief Complaint Headache Reason for Call Request to Schedule Office Appointment Initial Comment Caller states he was advised to come to the ER for pre-stroke symptoms. He went to the ED, CT scan and EKG look normal. He would like to schedule a f/u appointment asap. He has been having off and on headaches. Translation No No Triage Reason Other Nurse Assessment Nurse: Gibbs, RN, Ethan Date/Time (Eastern Time): 04/30/2020 8:30:31 PM Confirm and document reason for call. If symptomatic, describe symptoms. ---Caller states still in ER [facility] for stroke symptoms. Will be discharged soon and Wants to make appointment. Does the patient have any new or worsening symptoms? ---Yes Will a triage be completed? ---No [facility] reason for no triage. ---Other Please document clinical information provided and list any resource used. ---Patient is still in ER room under the care of physician- did not complete triage. Patient will call back for any new or worsening symptoms. Will follow-up in AM for appointment. Disp. Time (Eastern Time) Disposition Final User 04/30/2020 8:33:52 PM Clinical Call Yes Gibbs, RN, Ethan 

## 2020-05-01 NOTE — Telephone Encounter (Signed)
[facility] Night - Client TELEPHONE ADVICE RECORD AccessNurse Patient Name: Adam Colon Gender: Male DOB: 09-29-1985 Age: 35 Y 2 M 20 D Return Phone Number: [phone] (Primary), [phone] (Secondary) Address: [place]/ [place] [address] Client [facility] Night - Client Client Site [facility] - Night Physician [name] Contact Type Call Who Is Calling Patient / Member / Family / Caregiver Call Type Triage / Clinical Relationship To Patient Self Return Phone Number [phone] (Primary) Chief Complaint Headache Reason for Call Request to Schedule Office Appointment Initial Comment Caller states he was advised to come to the ER for pre-stroke symptoms. He went to the ED, CT scan and EKG look normal. He would like to schedule a f/u appointment asap. He has been having off and on headaches. Translation No No Triage Reason Other Nurse Assessment Nurse: Noelle Penner, RN, [place] Date/Time (Eastern Time): 04/30/2020 8:30:31 PM Confirm and document reason for call. If symptomatic, describe symptoms. ---Caller states still in ER [facility] for stroke symptoms. Will be discharged soon and Wants to make appointment. Does the patient have any new or worsening symptoms? ---Yes Will a triage be completed? ---No Select reason for no triage. ---Other Please document clinical information provided and list any resource used. ---Patient is still in ER room under the care of physician- did not complete triage. Patient will call back for any new or worsening symptoms. Will follow-up in AM for appointment. Disp. Time [name] Time) Disposition Final User 04/30/2020 8:33:52 PM Clinical Call Yes Noelle Penner, RN, [place] Derry

## 2020-05-02 NOTE — Telephone Encounter (Signed)
Pt scheduled appt for 5/2

## 2020-05-05 ENCOUNTER — Ambulatory Visit (INDEPENDENT_AMBULATORY_CARE_PROVIDER_SITE_OTHER): Payer: Managed Care, Other (non HMO) | Admitting: Family Medicine

## 2020-05-05 ENCOUNTER — Other Ambulatory Visit: Payer: Self-pay

## 2020-05-05 VITALS — BP 120/70 | HR 85 | Temp 97.9°F | Ht 73.0 in | Wt 209.2 lb

## 2020-05-05 DIAGNOSIS — Z113 Encounter for screening for infections with a predominantly sexual mode of transmission: Secondary | ICD-10-CM

## 2020-05-05 DIAGNOSIS — R253 Fasciculation: Secondary | ICD-10-CM | POA: Diagnosis not present

## 2020-05-05 DIAGNOSIS — R202 Paresthesia of skin: Secondary | ICD-10-CM | POA: Diagnosis not present

## 2020-05-05 DIAGNOSIS — M542 Cervicalgia: Secondary | ICD-10-CM | POA: Diagnosis not present

## 2020-05-05 DIAGNOSIS — R519 Headache, unspecified: Secondary | ICD-10-CM | POA: Insufficient documentation

## 2020-05-05 DIAGNOSIS — R7309 Other abnormal glucose: Secondary | ICD-10-CM

## 2020-05-05 DIAGNOSIS — Z1159 Encounter for screening for other viral diseases: Secondary | ICD-10-CM

## 2020-05-05 MED ORDER — VALACYCLOVIR HCL 500 MG PO TABS: 500.0000 mg | ORAL_TABLET | Freq: Every day | ORAL | 3 refills | Status: DC
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
## 2020-05-05 NOTE — Patient Instructions (Signed)
Headache/arm pain - referral to physical therapy  Fasciculations (twitching) - blood work to evaluate for autoimmune or muscle disease - call if you start experiencing weakness

## 2020-05-05 NOTE — Assessment & Plan Note (Signed)
Persistent symptoms w/o obvious trigger. Discussed with numbness and recent episode will get some initial labs to evaluate for inflammatory disease. Normal strength and reflexes so overall reassuring. Eval for AML or inflammatory disease. Monitor for worsening.

## 2020-05-05 NOTE — Assessment & Plan Note (Signed)
Suspect this may be 2/2 chronic neck pain. Discussed PT referral to start and see if that improves HA/Neck pain/tingling in left hand. Return if not improving

## 2020-05-06 ENCOUNTER — Encounter: Payer: Self-pay | Admitting: Family Medicine

## 2020-05-06 DIAGNOSIS — R748 Abnormal levels of other serum enzymes: Secondary | ICD-10-CM

## 2020-05-06 LAB — TSH: TSH: 2.35 u[IU]/mL (ref 0.35–4.50)

## 2020-05-06 LAB — CK: Total CK: 363 U/L — ABNORMAL HIGH (ref 7–232)

## 2020-05-06 LAB — SEDIMENTATION RATE: Sed Rate: 5 mm/hr (ref 0–15)

## 2020-05-06 LAB — HEMOGLOBIN A1C: Hgb A1c MFr Bld: 5.8 % (ref 4.6–6.5)

## 2020-05-06 LAB — VITAMIN B12: Vitamin B-12: 1202 pg/mL — ABNORMAL HIGH (ref 211–911)

## 2020-05-06 NOTE — Addendum Note (Signed)
Addended by: Lynnda Child on: 05/06/2020 05:29 PM   Modules accepted: Orders

## 2020-05-07 ENCOUNTER — Other Ambulatory Visit: Payer: Self-pay | Admitting: Family Medicine

## 2020-05-07 DIAGNOSIS — R768 Other specified abnormal immunological findings in serum: Secondary | ICD-10-CM

## 2020-05-07 DIAGNOSIS — R253 Fasciculation: Secondary | ICD-10-CM

## 2020-05-07 DIAGNOSIS — R7689 Other specified abnormal immunological findings in serum: Secondary | ICD-10-CM

## 2020-05-07 DIAGNOSIS — R748 Abnormal levels of other serum enzymes: Secondary | ICD-10-CM

## 2020-05-07 LAB — ANTI-NUCLEAR AB-TITER (ANA TITER): ANA Titer 1: 1:40 {titer} — ABNORMAL HIGH

## 2020-05-07 LAB — HEPATITIS C ANTIBODY
Hepatitis C Ab: NONREACTIVE
SIGNAL TO CUT-OFF: 0.01 (ref <1.00)

## 2020-05-07 LAB — RPR: RPR Ser Ql: NONREACTIVE

## 2020-05-07 LAB — ANA: Anti Nuclear Antibody (ANA): POSITIVE — AB

## 2020-05-07 LAB — RHEUMATOID FACTOR: Rheumatoid fact SerPl-aCnc: <14 IU/mL (ref <14)

## 2020-05-07 LAB — C. TRACHOMATIS/N. GONORRHOEAE RNA
C. trachomatis RNA, TMA: NOT DETECTED
N. gonorrhoeae RNA, TMA: NOT DETECTED

## 2020-05-16 DIAGNOSIS — F4320 Adjustment disorder, unspecified: Secondary | ICD-10-CM | POA: Diagnosis not present

## 2020-05-23 ENCOUNTER — Encounter: Payer: Self-pay | Admitting: Physical Therapy

## 2020-05-23 ENCOUNTER — Other Ambulatory Visit: Payer: Self-pay

## 2020-05-23 ENCOUNTER — Ambulatory Visit (INDEPENDENT_AMBULATORY_CARE_PROVIDER_SITE_OTHER): Payer: BC Managed Care – PPO | Admitting: Physical Therapy

## 2020-05-23 DIAGNOSIS — M542 Cervicalgia: Secondary | ICD-10-CM | POA: Diagnosis not present

## 2020-05-23 DIAGNOSIS — M546 Pain in thoracic spine: Secondary | ICD-10-CM

## 2020-05-23 DIAGNOSIS — R293 Abnormal posture: Secondary | ICD-10-CM

## 2020-05-23 NOTE — Patient Instructions (Signed)
Access Code: [email or web address] URL: [email or web address] Date: 05/23/2020 Prepared by: Moshe Cipro  Exercises Prone W Scapular Retraction - 1 x daily - 7 x weekly - 3 sets - 10 reps Prone Scapular Retraction - 1 x daily - 7 x weekly - 3 sets - 10 reps Prone Scapular Retraction Arms at Side - 1 x daily - 7 x weekly - 3 sets - 10 reps  Patient Education Trigger Point Dry Needling
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
## 2020-05-27 ENCOUNTER — Encounter: Payer: Self-pay | Admitting: Physical Therapy

## 2020-05-27 ENCOUNTER — Ambulatory Visit: Payer: BC Managed Care – PPO | Admitting: Physical Therapy

## 2020-05-27 ENCOUNTER — Other Ambulatory Visit: Payer: Self-pay

## 2020-05-27 DIAGNOSIS — M542 Cervicalgia: Secondary | ICD-10-CM

## 2020-05-27 DIAGNOSIS — R293 Abnormal posture: Secondary | ICD-10-CM | POA: Diagnosis not present

## 2020-05-27 DIAGNOSIS — M546 Pain in thoracic spine: Secondary | ICD-10-CM | POA: Diagnosis not present

## 2020-05-27 NOTE — Therapy (Signed)
[facility] Physical Therapy [address] [place], [place], [address] Phone: [phone]   Fax:  [phone]  Physical Therapy Treatment  Patient Details  Name: Adam Colon MRN: [id number] Date of Birth: 1985-07-16 Referring Provider (PT): Lynnda Child, MD   Encounter Date: 05/27/2020   PT End of Session - 05/27/20 1544    Visit Number 2    Number of Visits 6    Date for PT Re-Evaluation 07/04/20    PT Start Time 1519    PT Stop Time 1600    PT Time Calculation (min) 41 min    Activity Tolerance Patient tolerated treatment well    Behavior During Therapy [facility] for tasks assessed/performed           Past Medical History:  Diagnosis Date  . DDD (degenerative disc disease), cervical   . HSV-2 infection   . Migraine     Past Surgical History:  Procedure Laterality Date  . NO PAST SURGERIES      There were no vitals filed for this visit.   Subjective Assessment - 05/27/20 1526    Subjective Pt arriving today reporting 6-7/10 pain in thoracic spine. Pt with pin point pain left levator. Pt reporting good responset to dry needling last visit. Pt reporting only mild soreness then relief following his visit.    Diagnostic tests xrays: arthritis    Patient Stated Goals impove pain    Currently in Pain? Yes    Pain Score 7     Pain Location Back    Pain Orientation Left;Mid;Right    Pain Descriptors / Indicators Sore    Pain Type Chronic pain    Pain Onset More than a month ago                             [facility] Adult PT Treatment/Exercise - 05/27/20 0001      Exercises   Exercises Other Exercises;Neck      Neck Exercises: Supine   Other Supine Exercise lying on 1/2 bolster both in vertical and horizontal positions x 2 minutes with UE's stretched out in flexion above his head.      Neck Exercises: Prone   Other Prone Exercise Cat/Camel x 10 holding 10 seconds each      Manual Therapy   Manual Therapy Soft tissue mobilization     Manual therapy comments 15 minutes    Soft tissue mobilization IASTM to levator scapula and left upper trap, compression during dry needling, compression to rhomboids      Neck Exercises: Stretches   Upper Trapezius Stretch Right;Left;3 reps;10 seconds    Levator Stretch Left;Right;3 reps;10 seconds    Corner Stretch Limitations Door stretch x 3 holding 20 seconds    Other Neck Stretches QL door stretch x 5 holding 10 seconds            Trigger Point Dry Needling - 05/27/20 0001    Consent Given? Yes    Education Handout Provided Yes    Muscles Treated Upper Quadrant Rhomboids    Rhomboids Response Twitch response elicited   bil                 PT Short Term Goals - 05/27/20 1547      PT SHORT TERM GOAL #1   Title independent with initial HEP    Status On-going             PT Long Term Goals -  05/27/20 1547      PT LONG TERM GOAL #1   Title independent with advanced HEP    Status On-going      PT LONG TERM GOAL #2   Title perform cervical sidebending without increase in symptoms for improved function    Status On-going      PT LONG TERM GOAL #3   Title FOTO score improved to 72 for improved function    Status On-going      PT LONG TERM GOAL #4   Title Lt grip strength improved by at least 4# for improved function    Status On-going                 Plan - 05/27/20 1544    Clinical Impression Statement Pt arriving reporting going to gym earlier today. Pt arriving reporting 6-7/10 pain in thoracic spine with pin point pain/tenderness left levator and left upper trap. Pt with good response to dry needling last visit and it was repeated today by Moshe Cipro, PT, DPT. Continue with skilled PT to maximize function.    Personal Factors and Comorbidities Comorbidity 2    Comorbidities DDD, migraines    Examination-Activity Limitations Caring for Others;Carry;Lift;Reach Overhead    Examination-Participation Restrictions Occupation;Community  Activity    Stability/Clinical Decision Making Stable/Uncomplicated    Rehab Potential Good    PT Frequency 1x / week    PT Duration 6 weeks    PT Treatment/Interventions ADLs/Self Care Home Management;Cryotherapy;Electrical Stimulation;Iontophoresis 4mg /ml Dexamethasone;Moist Heat;Traction;Therapeutic exercise;Therapeutic activities;Ultrasound;Neuromuscular re-education;Patient/family education;Manual techniques;Taping;Dry needling    PT Next Visit Plan review HEP, continue with scap stabilizing exercises, assess response to DN and repeat PRN    PT Home Exercise Plan Access Code: [email or web address]    Consulted and Agree with Plan of Care Patient           Patient will benefit from skilled therapeutic intervention in order to improve the following deficits and impairments:  Postural dysfunction,Decreased strength,Pain,Impaired UE functional use,Increased fascial restricitons,Increased muscle spasms  Visit Diagnosis: Cervicalgia  Pain in thoracic spine  Abnormal posture     Problem List Patient Active Problem List   Diagnosis Date Noted  . Fasciculations of muscle 05/05/2020  . Tingling of left upper extremity 05/05/2020  . Left-sided headache 05/05/2020  . Acid reflux 01/16/2019  . Chronic migraine without aura without status migrainosus, not intractable 12/26/2018  . Neck pain 12/26/2018  . Family history of prostate cancer 12/26/2018  . Hx of herpes genitalis 12/26/2018    12/28/2018, PT, MPT 05/27/2020, 3:50 PM  05/29/2020, PT, DPT 05/27/20 3:52 PM    [facility] Physical Therapy [address] [place], [place], [place] Phone: [phone]   Fax:  [phone]  Name: Lazar Tierce MRN: Ronalee Red Date of Birth: [birth year]

## 2020-05-29 DIAGNOSIS — R748 Abnormal levels of other serum enzymes: Secondary | ICD-10-CM | POA: Diagnosis not present

## 2020-05-29 DIAGNOSIS — R768 Other specified abnormal immunological findings in serum: Secondary | ICD-10-CM | POA: Diagnosis not present

## 2020-05-29 DIAGNOSIS — R5383 Other fatigue: Secondary | ICD-10-CM | POA: Diagnosis not present

## 2020-05-29 DIAGNOSIS — M2550 Pain in unspecified joint: Secondary | ICD-10-CM | POA: Diagnosis not present

## 2020-05-29 DIAGNOSIS — R29898 Other symptoms and signs involving the musculoskeletal system: Secondary | ICD-10-CM | POA: Diagnosis not present

## 2020-06-04 ENCOUNTER — Encounter: Payer: Self-pay | Admitting: Physical Therapy

## 2020-06-04 ENCOUNTER — Other Ambulatory Visit: Payer: Self-pay

## 2020-06-04 ENCOUNTER — Ambulatory Visit: Payer: BC Managed Care – PPO | Admitting: Physical Therapy

## 2020-06-04 DIAGNOSIS — M546 Pain in thoracic spine: Secondary | ICD-10-CM

## 2020-06-04 DIAGNOSIS — R293 Abnormal posture: Secondary | ICD-10-CM | POA: Diagnosis not present

## 2020-06-04 DIAGNOSIS — M542 Cervicalgia: Secondary | ICD-10-CM | POA: Diagnosis not present

## 2020-06-04 NOTE — Therapy (Signed)
[facility] Physical Therapy [address] [place], [place], [address] Phone: [phone]   Fax:  [phone]  Physical Therapy Treatment  Patient Details  Name: Adam Colon MRN: [id number] Date of Birth: 1985/03/25 Referring Provider (PT): Lynnda Child, MD   Encounter Date: 06/04/2020   PT End of Session - 06/04/20 1245    Visit Number 3    Number of Visits 6    Date for PT Re-Evaluation 07/04/20    PT Start Time 0932    PT Stop Time 1004    PT Time Calculation (min) 32 min    Activity Tolerance Patient tolerated treatment well    Behavior During Therapy [facility] for tasks assessed/performed           Past Medical History:  Diagnosis Date  . DDD (degenerative disc disease), cervical   . HSV-2 infection   . Migraine     Past Surgical History:  Procedure Laterality Date  . NO PAST SURGERIES      There were no vitals filed for this visit.   Subjective Assessment - 06/04/20 0935    Subjective back is much improved; hasn't felt much back there since starting PT.    Diagnostic tests xrays: arthritis    Patient Stated Goals impove pain    Currently in Pain? No/denies              [facility] PT Assessment - 06/04/20 1241      Assessment   Medical Diagnosis M54.2 (ICD-10-CM) - Neck pain    Referring Provider (PT) Lynnda Child, MD      Posture/Postural Control   Posture Comments Lt scapular winging noted; also with area of increased edema noted around infraspinatus on Lt in supine                         [facility] Adult PT Treatment/Exercise - 06/04/20 0936      Neck Exercises: Machines for Strengthening   UBE (Upper Arm Bike) L5 x 8 min (alt 2' fwd/bwd)      Manual Therapy   Manual Therapy Soft tissue mobilization    Soft tissue mobilization compression to infraspinatus and rhomboids - skilled palpation and monitoring to soft tissue during DN            Trigger Point Dry Needling - 06/04/20 1244    Consent Given? Yes     Education Handout Provided Previously provided    Muscles Treated Upper Quadrant Infraspinatus;Subscapularis;Rhomboids    Rhomboids Response Twitch response elicited    Infraspinatus Response Twitch response elicited    Subscapularis Response Twitch response elicited                  PT Short Term Goals - 05/27/20 1547      PT SHORT TERM GOAL #1   Title independent with initial HEP    Status On-going             PT Long Term Goals - 05/27/20 1547      PT LONG TERM GOAL #1   Title independent with advanced HEP    Status On-going      PT LONG TERM GOAL #2   Title perform cervical sidebending without increase in symptoms for improved function    Status On-going      PT LONG TERM GOAL #3   Title FOTO score improved to 72 for improved function    Status On-going      PT LONG TERM  GOAL #4   Title Lt grip strength improved by at least 4# for improved function    Status On-going                 Plan - 06/04/20 1249    Clinical Impression Statement Pt tolerated session well today and overall reports improvement in symptoms.  He has an area of increased edema over Lt scapula that may be worth having MD look at next week.  Will continue to benefit from PT to maximize function.  Recommended trial of increasing weight at gym to progress to prior gym workouts.    Personal Factors and Comorbidities Comorbidity 2    Comorbidities DDD, migraines    Examination-Activity Limitations Caring for Others;Carry;Lift;Reach Overhead    Examination-Participation Restrictions Occupation;Community Activity    Stability/Clinical Decision Making Stable/Uncomplicated    Rehab Potential Good    PT Frequency 1x / week    PT Duration 6 weeks    PT Treatment/Interventions ADLs/Self Care Home Management;Cryotherapy;Electrical Stimulation;Iontophoresis 4mg /ml Dexamethasone;Moist Heat;Traction;Therapeutic exercise;Therapeutic activities;Ultrasound;Neuromuscular re-education;Patient/family  education;Manual techniques;Taping;Dry needling    PT Next Visit Plan continue with scap stabilizing exercises, assess response to DN and repeat PRN    PT Home Exercise Plan Access Code: [email or web address]    Consulted and Agree with Plan of Care Patient           Patient will benefit from skilled therapeutic intervention in order to improve the following deficits and impairments:  Postural dysfunction,Decreased strength,Pain,Impaired UE functional use,Increased fascial restricitons,Increased muscle spasms  Visit Diagnosis: Cervicalgia  Pain in thoracic spine  Abnormal posture     Problem List Patient Active Problem List   Diagnosis Date Noted  . Fasciculations of muscle 05/05/2020  . Tingling of left upper extremity 05/05/2020  . Left-sided headache 05/05/2020  . Acid reflux 01/16/2019  . Chronic migraine without aura without status migrainosus, not intractable 12/26/2018  . Neck pain 12/26/2018  . Family history of prostate cancer 12/26/2018  . Hx of herpes genitalis 12/26/2018      12/28/2018, PT, DPT 06/04/20 12:53 PM    [facility] Physical Therapy [address] [place], [place], [place] Phone: [phone]   Fax:  [phone]  Name: Adam Colon MRN: Ronalee Red Date of Birth: 1985/07/05

## 2020-06-05 ENCOUNTER — Encounter: Payer: Self-pay | Admitting: Family Medicine

## 2020-06-05 DIAGNOSIS — R768 Other specified abnormal immunological findings in serum: Secondary | ICD-10-CM | POA: Diagnosis not present

## 2020-06-05 DIAGNOSIS — R5383 Other fatigue: Secondary | ICD-10-CM | POA: Diagnosis not present

## 2020-06-05 DIAGNOSIS — R29898 Other symptoms and signs involving the musculoskeletal system: Secondary | ICD-10-CM | POA: Diagnosis not present

## 2020-06-05 DIAGNOSIS — R748 Abnormal levels of other serum enzymes: Secondary | ICD-10-CM | POA: Diagnosis not present

## 2020-06-12 ENCOUNTER — Other Ambulatory Visit: Payer: Self-pay

## 2020-06-12 ENCOUNTER — Encounter: Payer: Self-pay | Admitting: Physical Therapy

## 2020-06-12 ENCOUNTER — Ambulatory Visit: Payer: BC Managed Care – PPO | Admitting: Physical Therapy

## 2020-06-12 DIAGNOSIS — M542 Cervicalgia: Secondary | ICD-10-CM | POA: Diagnosis not present

## 2020-06-12 DIAGNOSIS — R293 Abnormal posture: Secondary | ICD-10-CM | POA: Diagnosis not present

## 2020-06-12 DIAGNOSIS — M546 Pain in thoracic spine: Secondary | ICD-10-CM | POA: Diagnosis not present

## 2020-06-12 NOTE — Therapy (Signed)
[facility] Physical Therapy [address] [place], [place], [address] Phone: [phone]   Fax:  [phone]  Physical Therapy Treatment  Patient Details  Name: Adam Colon MRN: [id number] Date of Birth: [birth year] Referring Provider (PT): Lynnda Child, MD   Encounter Date: 06/12/2020   PT End of Session - 06/12/20 1541     Visit Number 4    Number of Visits 6    Date for PT Re-Evaluation 07/04/20    PT Start Time 1515    PT Stop Time 1540    PT Time Calculation (min) 25 min    Activity Tolerance Patient tolerated treatment well    Behavior During Therapy [facility] for tasks assessed/performed             Past Medical History:  Diagnosis Date   DDD (degenerative disc disease), cervical    HSV-2 infection    Migraine     Past Surgical History:  Procedure Laterality Date   NO PAST SURGERIES      There were no vitals filed for this visit.   Subjective Assessment - 06/12/20 1516     Subjective muscle enzymes are normalizing, so those lab values seem to be improving    Diagnostic tests xrays: arthritis    Patient Stated Goals impove pain    Currently in Pain? No/denies                               [facility] Adult PT Treatment/Exercise - 06/12/20 1518       Neck Exercises: Machines for Strengthening   UBE (Upper Arm Bike) L7x 8 min (4' each direction      Manual Therapy   Soft tissue mobilization compression to homboids - skilled palpation and monitoring to soft tissue during DN              Trigger Point Dry Needling - 06/12/20 1540     Consent Given? Yes    Education Handout Provided Previously provided    Muscles Treated Upper Quadrant Rhomboids    Rhomboids Response Twitch response elicited                    PT Short Term Goals - 06/12/20 1541       PT SHORT TERM GOAL #1   Title independent with initial HEP    Status Achieved               PT Long Term Goals - 05/27/20 1547       PT  LONG TERM GOAL #1   Title independent with advanced HEP    Status On-going      PT LONG TERM GOAL #2   Title perform cervical sidebending without increase in symptoms for improved function    Status On-going      PT LONG TERM GOAL #3   Title FOTO score improved to 72 for improved function    Status On-going      PT LONG TERM GOAL #4   Title Lt grip strength improved by at least 4# for improved function    Status On-going                   Plan - 06/12/20 1541     Clinical Impression Statement Pt with decreasing pain and overall prior lab values appear to be stabilizing which is positive.  He still has area of active trigger points in Lt rhomboid and  subscap with good response to manual therapy and DN.  Anticipate d/c next 1-2 visits.    Personal Factors and Comorbidities Comorbidity 2    Comorbidities DDD, migraines    Examination-Activity Limitations Caring for Others;Carry;Lift;Reach Overhead    Examination-Participation Restrictions Occupation;Community Activity    Stability/Clinical Decision Making Stable/Uncomplicated    Rehab Potential Good    PT Frequency 1x / week    PT Duration 6 weeks    PT Treatment/Interventions ADLs/Self Care Home Management;Cryotherapy;Electrical Stimulation;Iontophoresis 4mg /ml Dexamethasone;Moist Heat;Traction;Therapeutic exercise;Therapeutic activities;Ultrasound;Neuromuscular re-education;Patient/family education;Manual techniques;Taping;Dry needling    PT Next Visit Plan continue with scap stabilizing exercises, assess response to DN and repeat PRN    PT Home Exercise Plan Access Code: [email or web address]    Consulted and Agree with Plan of Care Patient             Patient will benefit from skilled therapeutic intervention in order to improve the following deficits and impairments:  Postural dysfunction, Decreased strength, Pain, Impaired UE functional use, Increased fascial restricitons, Increased muscle spasms  Visit  Diagnosis: Cervicalgia  Abnormal posture  Pain in thoracic spine     Problem List Patient Active Problem List   Diagnosis Date Noted   Fasciculations of muscle 05/05/2020   Tingling of left upper extremity 05/05/2020   Left-sided headache 05/05/2020   Acid reflux 01/16/2019   Chronic migraine without aura without status migrainosus, not intractable 12/26/2018   Neck pain 12/26/2018   Family history of prostate cancer 12/26/2018   Hx of herpes genitalis 12/26/2018      12/28/2018, PT, DPT 06/12/20 3:43 PM     [facility] Physical Therapy [address] [place], [place], [place] Phone: [phone]   Fax:  [phone]  Name: Marcellas Marchant MRN: Ronalee Red Date of Birth: 1985-02-09

## 2020-06-13 ENCOUNTER — Ambulatory Visit: Payer: Managed Care, Other (non HMO) | Admitting: Internal Medicine

## 2020-06-17 ENCOUNTER — Ambulatory Visit: Payer: Managed Care, Other (non HMO) | Admitting: Family Medicine

## 2020-06-19 ENCOUNTER — Other Ambulatory Visit: Payer: Self-pay

## 2020-06-19 ENCOUNTER — Encounter: Payer: Self-pay | Admitting: Physical Therapy

## 2020-06-19 ENCOUNTER — Ambulatory Visit: Payer: BC Managed Care – PPO | Admitting: Physical Therapy

## 2020-06-19 DIAGNOSIS — R293 Abnormal posture: Secondary | ICD-10-CM

## 2020-06-19 DIAGNOSIS — M542 Cervicalgia: Secondary | ICD-10-CM

## 2020-06-19 DIAGNOSIS — M546 Pain in thoracic spine: Secondary | ICD-10-CM | POA: Diagnosis not present

## 2020-06-19 NOTE — Therapy (Signed)
[facility] Physical Therapy [address] [place], [place], [address] Phone: [phone]   Fax:  [phone]  Physical Therapy Treatment  Patient Details  Name: Adam Colon MRN: [id number] Date of Birth: 26-Aug-1985 Referring Provider (PT): Lynnda Child, MD   Encounter Date: 06/19/2020   PT End of Session - 06/19/20 1548     Visit Number 5    Number of Visits 6    Date for PT Re-Evaluation 07/04/20    PT Start Time 1515    PT Stop Time 1545    PT Time Calculation (min) 30 min    Activity Tolerance Patient tolerated treatment well    Behavior During Therapy [facility] for tasks assessed/performed             Past Medical History:  Diagnosis Date   DDD (degenerative disc disease), cervical    HSV-2 infection    Migraine     Past Surgical History:  Procedure Laterality Date   NO PAST SURGERIES      There were no vitals filed for this visit.   Subjective Assessment - 06/19/20 1516     Subjective started lifting heavier and pain is minimal    Diagnostic tests xrays: arthritis    Patient Stated Goals impove pain    Currently in Pain? No/denies                               [facility] Adult PT Treatment/Exercise - 06/19/20 1519       Self-Care   Self-Care Other Self-Care Comments    Other Self-Care Comments  discussed weight bearing activiites for scap stabilization exercises      Neck Exercises: Machines for Strengthening   UBE (Upper Arm Bike) L7x 8 min (4' each direction      Manual Therapy   Soft tissue mobilization compression to rhomboids - skilled palpation and monitoring to soft tissue during DN              Trigger Point Dry Needling - 06/19/20 1547     Consent Given? Yes    Education Handout Provided Previously provided    Muscles Treated Upper Quadrant Rhomboids    Rhomboids Response Twitch response elicited                    PT Short Term Goals - 06/12/20 1541       PT SHORT TERM GOAL #1    Title independent with initial HEP    Status Achieved               PT Long Term Goals - 05/27/20 1547       PT LONG TERM GOAL #1   Title independent with advanced HEP    Status On-going      PT LONG TERM GOAL #2   Title perform cervical sidebending without increase in symptoms for improved function    Status On-going      PT LONG TERM GOAL #3   Title FOTO score improved to 72 for improved function    Status On-going      PT LONG TERM GOAL #4   Title Lt grip strength improved by at least 4# for improved function    Status On-going                   Plan - 06/19/20 1548     Clinical Impression Statement Pt reporting decreased pain today and has  started lifting heavier weights without increased pain at this time.  Anticipate he will be ready for d/c or hold from PT after next session.  Continued positive response to manual therapy and DN at this time.    Personal Factors and Comorbidities Comorbidity 2    Comorbidities DDD, migraines    Examination-Activity Limitations Caring for Others;Carry;Lift;Reach Overhead    Examination-Participation Restrictions Occupation;Community Activity    Stability/Clinical Decision Making Stable/Uncomplicated    Rehab Potential Good    PT Frequency 1x / week    PT Duration 6 weeks    PT Treatment/Interventions ADLs/Self Care Home Management;Cryotherapy;Electrical Stimulation;Iontophoresis 4mg /ml Dexamethasone;Moist Heat;Traction;Therapeutic exercise;Therapeutic activities;Ultrasound;Neuromuscular re-education;Patient/family education;Manual techniques;Taping;Dry needling    PT Next Visit Plan continue with scap stabilizing exercises, assess response to DN and repeat PRN; check goals/FOTO and discuss possible hold/d/c    PT Home Exercise Plan Access Code: [email or web address]    Consulted and Agree with Plan of Care Patient             Patient will benefit from skilled therapeutic intervention in order to improve the following deficits  and impairments:  Postural dysfunction, Decreased strength, Pain, Impaired UE functional use, Increased fascial restricitons, Increased muscle spasms  Visit Diagnosis: Cervicalgia  Abnormal posture  Pain in thoracic spine     Problem List Patient Active Problem List   Diagnosis Date Noted   Fasciculations of muscle 05/05/2020   Tingling of left upper extremity 05/05/2020   Left-sided headache 05/05/2020   Acid reflux 01/16/2019   Chronic migraine without aura without status migrainosus, not intractable 12/26/2018   Neck pain 12/26/2018   Family history of prostate cancer 12/26/2018   Hx of herpes genitalis 12/26/2018      12/28/2018, PT, DPT 06/19/20 3:50 PM    [facility] Physical Therapy [address] [place], [place], [place] Phone: [phone]   Fax:  [phone]  Name: Adam Colon MRN: Ronalee Red Date of Birth: 12-Nov-1985

## 2020-06-23 ENCOUNTER — Encounter: Payer: Self-pay | Admitting: Family Medicine

## 2020-06-23 ENCOUNTER — Ambulatory Visit: Payer: BC Managed Care – PPO | Admitting: Family Medicine

## 2020-06-23 ENCOUNTER — Other Ambulatory Visit: Payer: Self-pay

## 2020-06-23 VITALS — BP 100/70 | HR 67 | Temp 97.9°F | Ht 73.0 in | Wt 206.5 lb

## 2020-06-23 DIAGNOSIS — R748 Abnormal levels of other serum enzymes: Secondary | ICD-10-CM

## 2020-06-23 DIAGNOSIS — R2232 Localized swelling, mass and lump, left upper limb: Secondary | ICD-10-CM | POA: Diagnosis not present

## 2020-06-23 DIAGNOSIS — M898X1 Other specified disorders of bone, shoulder: Secondary | ICD-10-CM | POA: Diagnosis not present
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
## 2020-06-24 ENCOUNTER — Encounter: Payer: Self-pay | Admitting: Family Medicine

## 2020-06-24 ENCOUNTER — Ambulatory Visit: Payer: BC Managed Care – PPO | Admitting: Family Medicine

## 2020-06-24 VITALS — BP 104/64 | HR 92 | Temp 97.8°F | Ht 73.0 in | Wt 204.2 lb

## 2020-06-24 DIAGNOSIS — R253 Fasciculation: Secondary | ICD-10-CM

## 2020-06-24 DIAGNOSIS — S29012D Strain of muscle and tendon of back wall of thorax, subsequent encounter: Secondary | ICD-10-CM | POA: Diagnosis not present

## 2020-06-24 DIAGNOSIS — R2241 Localized swelling, mass and lump, right lower limb: Secondary | ICD-10-CM | POA: Diagnosis not present

## 2020-06-24 DIAGNOSIS — S29012A Strain of muscle and tendon of back wall of thorax, initial encounter: Secondary | ICD-10-CM | POA: Insufficient documentation

## 2020-06-24 NOTE — Progress Notes (Signed)
Subjective:     Adam Colon is a 35 y.o. male presenting for Follow-up (Neck Pain/Rheumatology referral)     HPI  #rheumatology - has gone a few times since then - has been following the muscle enzymes which have been low/high/normal - next step is to plan for neurology for nerve study - no further tingling  - still having back issues - ultrasound planned for lesion on his back - physical therapist is encouraged that the mass is reassuring - no recent episodes like before  Continues to have muscle twitching - seeing neurology for this  #Neck pain  - more rhomboid pain  - physical therapy - doing dry needling which has been helping - working on Geologist, engineering with his home exercise   Review of Systems   Social History   Tobacco Use  Smoking Status Never  Smokeless Tobacco Never        Objective:    BP Readings from Last 3 Encounters:  06/24/20 104/64  06/23/20 100/70  05/05/20 120/70   Wt Readings from Last 3 Encounters:  06/24/20 204 lb 4 oz (92.6 kg)  06/23/20 206 lb 8 oz (93.7 kg)  05/05/20 209 lb 4 oz (94.9 kg)    BP 104/64   Pulse 92   Temp 97.8 F (36.6 C) (Temporal)   Ht 6\' 1"  (1.854 m)   Wt 204 lb 4 oz (92.6 kg)   SpO2 97%   BMI 26.95 kg/m    Physical Exam Constitutional:      Appearance: Normal appearance. He is not ill-appearing or diaphoretic.  HENT:     Right Ear: External ear normal.     Left Ear: External ear normal.     Nose: Nose normal.  Eyes:     General: No scleral icterus.    Extraocular Movements: Extraocular movements intact.     Conjunctiva/sclera: Conjunctivae normal.  Cardiovascular:     Rate and Rhythm: Normal rate.  Pulmonary:     Effort: Pulmonary effort is normal.  Musculoskeletal:     Cervical back: Neck supple.  Skin:    General: Skin is warm and dry.     Comments: Right shin with small <5 mm subdural mobile soft nodule  Neurological:     Mental Status: He is alert. Mental status is at baseline.   Psychiatric:        Mood and Affect: Mood normal.          Assessment & Plan:   Problem List Items Addressed This Visit       Musculoskeletal and Integument   Rhomboid muscle strain    Improving with physical therapy. Also saw Dr. . Cont PT and regular exercise.          Other   Fasciculations of muscle - Primary    Reviewed recent labs from outside Rheumatology with patient. Thus far work-up normal and reassuring. Did have CK peak to >2000 but it is trending back down. Rheumatology has referred him to neurology for nerve testing. Discussed this makes sense as the next step. Encouraged him to monitor for worsening symptoms but still w/o weakness and doing well.        Other Visit Diagnoses     Nodule of skin of right lower leg          Reassurance regarding benign nodule.   I spent >25 minutes with pt , obtaining history, examining, reviewing chart, documenting encounter and discussing the above plan of care.   Return in about  1 year (around 06/24/2021), or if symptoms worsen or fail to improve, for annual .    Lynnda Child, MD  This visit occurred during the SARS-CoV-2 public health emergency.  Safety protocols were in place, including screening questions prior to the visit, additional usage of staff PPE, and extensive cleaning of exam room while observing appropriate contact time as indicated for disinfecting solutions.

## 2020-06-24 NOTE — Patient Instructions (Signed)
Glad things are going well!  Come back as needed

## 2020-06-24 NOTE — Assessment & Plan Note (Signed)
Reviewed recent labs from outside Rheumatology with patient. Thus far work-up normal and reassuring. Did have CK peak to >2000 but it is trending back down. Rheumatology has referred him to neurology for nerve testing. Discussed this makes sense as the next step. Encouraged him to monitor for worsening symptoms but still w/o weakness and doing well.

## 2020-06-24 NOTE — Assessment & Plan Note (Signed)
Improving with physical therapy. Also saw Dr. Patsy Lager. Cont PT and regular exercise.

## 2020-06-26 ENCOUNTER — Ambulatory Visit (INDEPENDENT_AMBULATORY_CARE_PROVIDER_SITE_OTHER): Payer: BC Managed Care – PPO | Admitting: Physical Therapy

## 2020-06-26 ENCOUNTER — Other Ambulatory Visit: Payer: Self-pay

## 2020-06-26 ENCOUNTER — Encounter: Payer: Self-pay | Admitting: Physical Therapy

## 2020-06-26 DIAGNOSIS — R293 Abnormal posture: Secondary | ICD-10-CM

## 2020-06-26 DIAGNOSIS — M542 Cervicalgia: Secondary | ICD-10-CM

## 2020-06-26 DIAGNOSIS — M546 Pain in thoracic spine: Secondary | ICD-10-CM | POA: Diagnosis not present

## 2020-06-26 NOTE — Therapy (Addendum)
[facility] [address] [place], [place], [address] Phone: [phone]   Fax:  [phone]  Physical Therapy Treatment/Discharge Summary  Patient Details  Name: Adam Colon MRN: [id number] Date of Birth: 08/11/1985 Referring Provider (PT): Lesleigh Noe, MD   Encounter Date: 06/26/2020   PT End of Session - 06/26/20 1548     Visit Number 6    Number of Visits 6    Date for PT Re-Evaluation 07/04/20    PT Start Time 8676    PT Stop Time 1546    PT Time Calculation (min) 31 min    Activity Tolerance Patient tolerated treatment well    Behavior During Therapy [facility] for tasks assessed/performed             Past Medical History:  Diagnosis Date   DDD (degenerative disc disease), cervical    HSV-2 infection    Migraine     Past Surgical History:  Procedure Laterality Date   NO PAST SURGERIES      There were no vitals filed for this visit.   Subjective Assessment - 06/26/20 1509     Subjective back to working on lifting heavier; had increased pain but now it's starting to settle down    Diagnostic tests xrays: arthritis    Patient Stated Goals impove pain    Currently in Pain? No/denies                [facility] PT Assessment - 06/26/20 1537       Assessment   Medical Diagnosis M54.2 (ICD-10-CM) - Neck pain    Referring Provider (PT) Lesleigh Noe, MD      Observation/Other Assessments   Focus on Therapeutic Outcomes (FOTO)  63      Strength   Overall Strength Comments Rt grip 92.7# avg; Lt grip 88.2# avg                           [facility] Adult PT Treatment/Exercise - 06/26/20 1536       Manual Therapy   Soft tissue mobilization compression to Lt infraspinatus              Trigger Point Dry Needling - 06/26/20 1548     Consent Given? Yes    Education Handout Provided Previously provided    Muscles Treated Upper Quadrant Infraspinatus    Infraspinatus Response Twitch response elicited                     PT Short Term Goals - 06/12/20 1541       PT SHORT TERM GOAL #1   Title independent with initial HEP    Status Achieved               PT Long Term Goals - 06/26/20 1549       PT LONG TERM GOAL #1   Title independent with advanced HEP    Status Achieved      PT LONG TERM GOAL #2   Title perform cervical sidebending without increase in symptoms for improved function    Status Achieved      PT LONG TERM GOAL #3   Title FOTO score improved to 72 for improved function    Status On-going      PT LONG TERM GOAL #4   Title Lt grip strength improved by at least 4# for improved function    Status Achieved  Plan - 06/26/20 1549     Clinical Impression Statement Pt has met all goals except FOTO score which decreased 4% despite improvements in pain and overall function.  He has excellent understanding of exercise and strength training at this time.  Will hold PT and pt will call if needed.    Personal Factors and Comorbidities Comorbidity 2    Comorbidities DDD, migraines    Examination-Activity Limitations Caring for Others;Carry;Lift;Reach Overhead    Examination-Participation Restrictions Occupation;Community Activity    Stability/Clinical Decision Making Stable/Uncomplicated    Rehab Potential Good    PT Frequency 1x / week    PT Duration 6 weeks    PT Treatment/Interventions ADLs/Self Care Home Management;Cryotherapy;Electrical Stimulation;Iontophoresis 12m/ml Dexamethasone;Moist Heat;Traction;Therapeutic exercise;Therapeutic activities;Ultrasound;Neuromuscular re-education;Patient/family education;Manual techniques;Taping;Dry needling    PT Next Visit Plan hold PT, d/c if doesn't return, will need recert if he does    PT Home Exercise Plan Access Code: [email or web address]    Consulted and Agree with Plan of Care Patient             Patient will benefit from skilled therapeutic intervention in order to improve the  following deficits and impairments:  Postural dysfunction, Decreased strength, Pain, Impaired UE functional use, Increased fascial restricitons, Increased muscle spasms  Visit Diagnosis: Cervicalgia  Abnormal posture  Pain in thoracic spine     Problem List Patient Active Problem List   Diagnosis Date Noted   Rhomboid muscle strain 06/24/2020   Fasciculations of muscle 05/05/2020   Tingling of left upper extremity 05/05/2020   Left-sided headache 05/05/2020   Acid reflux 01/16/2019   Chronic migraine without aura without status migrainosus, not intractable 12/26/2018   Neck pain 12/26/2018   Family history of prostate cancer 12/26/2018   Hx of herpes genitalis 12/26/2018      SLaureen Abrahams PT, DPT 06/26/20 3:51 PM     [facility] [address] [address]Phone: [phone]  Fax:  [phone] Name: Adam LubertoMRN: [id number] of Birth: 2May 04, 1987    PHYSICAL THERAPY DISCHARGE SUMMARY  Visits from Start of Care: 6  Current functional level related to goals / functional outcomes: See above   Remaining deficits: See above   Education / Equipment: HEP   Patient agrees to discharge. Patient goals were partially met. Patient is being discharged due to meeting the stated rehab goals.   SLaureen Abrahams PT, DPT 08/18/20 2:43 PM   [facility] [address] [address]Phone: [phone]  Fax:  [phone]

## 2020-07-01 ENCOUNTER — Ambulatory Visit: Payer: Self-pay

## 2020-07-01 ENCOUNTER — Ambulatory Visit (INDEPENDENT_AMBULATORY_CARE_PROVIDER_SITE_OTHER): Payer: BC Managed Care – PPO | Admitting: Family Medicine

## 2020-07-01 ENCOUNTER — Encounter: Payer: Self-pay | Admitting: Family Medicine

## 2020-07-01 ENCOUNTER — Other Ambulatory Visit: Payer: Self-pay

## 2020-07-01 DIAGNOSIS — M25512 Pain in left shoulder: Secondary | ICD-10-CM | POA: Diagnosis not present

## 2020-07-01 DIAGNOSIS — M542 Cervicalgia: Secondary | ICD-10-CM

## 2020-07-01 NOTE — Progress Notes (Signed)
Office Visit Note   Patient: Adam Colon           Date of Birth: August 01, 1985           MRN: [id number] Visit Date: 07/01/2020 Requested by: Lynnda Child, MD [address] [place],  [place] [address] PCP: Lynnda Child, MD  Subjective: Chief Complaint  Patient presents with   Left Shoulder - Follow-up, Pain    Doing PT, had a lifting injury x 2 yrs ago, wants to have [place] to r/o any problems.Dr. Dallas Schimke did not feel that the [place] was needed.    HPI: He is here with neck pain and left shoulder pain.  About 2 years ago he was doing bench press and felt a twinge of pain in his left upper back/lower neck area.  He has been having pain since then.  He went to a chiropractor for a while and had x-rays obtained.  His symptoms were not going away so he started seeing Moshe Cipro for physical therapy recently.  He gets temporary relief from PT and dry needling but the pain is not going away completely.  He has not had any weakness in his arm.  The pain does not radiate down the right side.  He has also noticed a nodule on the left posterior shoulder and wondered whether it might be contributing to his pain.  The nodule has been there since he was injured, but has not changed in size.  He is otherwise in good medical health.              ROS:   All other systems were reviewed and are negative.  Objective: Vital Signs: There were no vitals taken for this visit.  Physical Exam:  General:  Alert and oriented, in no acute distress. Pulm:  Breathing unlabored. Psy:  Normal mood, congruent affect.  Neck: He has negative Spurling's test.  He has tender paraspinous muscles on the left side and in the medial trapezius on the left.  There is a soft tissue nodule in the left posterior shoulder that feels like a lipoma.  It is probably 5 cm diameter.  UE strength normal.     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  No worrisome features.   Assessment & Plan: Chronic neck and left posterior shoulder pain, cannot rule out cervical disc protrusion.  He has what appears to be a lipoma, I doubt it is causing his pain. -Elected to proceed with cervical spine MRI scan. -If he wanted the posterior shoulder mass removed for cosmetic reasons, that would be an option but I do not think it would give him pain relief.  He understands this.     Procedures: No procedures performed        PMFS History: Patient Active Problem List   Diagnosis Date Noted   Rhomboid muscle strain 06/24/2020   Fasciculations of muscle 05/05/2020   Tingling of left upper extremity 05/05/2020   Left-sided headache 05/05/2020   Acid reflux 01/16/2019   Chronic migraine without aura without status migrainosus, not intractable 12/26/2018   Neck pain 12/26/2018   Family history of prostate cancer 12/26/2018   Hx of herpes genitalis 12/26/2018   Past Medical History:  Diagnosis Date   DDD (degenerative disc disease), cervical    HSV-2 infection    Migraine     Family History  Problem Relation Age of Onset   Depression Mother    Anxiety disorder Mother    ADD / ADHD Father    Bipolar disorder Father    Heart murmur Father    Other Father        TBI, myofasial pain syndrome, trigeminal neuralgia   Learning disabilities Sister    Learning disabilities Brother    Epilepsy Maternal Grandmother    Prostate cancer Paternal Grandfather 3   Diabetes type I Cousin     Past Surgical History:  Procedure Laterality Date   NO PAST SURGERIES     Social History   Occupational History   Not on file  Tobacco Use   Smoking status: Never   Smokeless tobacco: Never  Vaping Use   Vaping Use: Never used  Substance and Sexual Activity   Alcohol use: Yes    Comment: 1 glass of wine daily   Drug use: No   Sexual activity: Yes    Birth control/protection: Surgical

## 2020-07-10 ENCOUNTER — Other Ambulatory Visit: Payer: BC Managed Care – PPO

## 2020-09-10 ENCOUNTER — Other Ambulatory Visit: Payer: BC Managed Care – PPO

## 2020-09-23 ENCOUNTER — Encounter: Payer: Self-pay | Admitting: Family Medicine

## 2021-01-08 ENCOUNTER — Encounter: Payer: Self-pay | Admitting: Family Medicine

## 2021-01-08 ENCOUNTER — Encounter: Payer: Self-pay | Admitting: Physical Therapy

## 2021-01-08 DIAGNOSIS — M545 Low back pain, unspecified: Secondary | ICD-10-CM

## 2021-01-19 ENCOUNTER — Ambulatory Visit: Payer: Self-pay | Admitting: Physical Therapy

## 2021-06-25 ENCOUNTER — Other Ambulatory Visit: Payer: Self-pay | Admitting: Family Medicine

## 2021-06-29 NOTE — Telephone Encounter (Signed)
Patient is scheduled   

## 2021-07-18 ENCOUNTER — Other Ambulatory Visit: Payer: Self-pay | Admitting: Family Medicine

## 2021-07-22 ENCOUNTER — Encounter: Payer: Self-pay | Admitting: Family Medicine

## 2021-07-22 ENCOUNTER — Ambulatory Visit (INDEPENDENT_AMBULATORY_CARE_PROVIDER_SITE_OTHER): Payer: BC Managed Care – PPO | Admitting: Family Medicine

## 2021-07-22 VITALS — BP 118/78 | HR 76 | Temp 97.1°F | Ht 72.0 in | Wt 209.5 lb

## 2021-07-22 DIAGNOSIS — Z0000 Encounter for general adult medical examination without abnormal findings: Secondary | ICD-10-CM | POA: Diagnosis not present

## 2021-07-22 DIAGNOSIS — Z8619 Personal history of other infectious and parasitic diseases: Secondary | ICD-10-CM | POA: Diagnosis not present

## 2021-07-22 DIAGNOSIS — R7303 Prediabetes: Secondary | ICD-10-CM | POA: Insufficient documentation

## 2021-07-22 LAB — COMPREHENSIVE METABOLIC PANEL
ALT: 31 U/L (ref 0–53)
AST: 35 U/L (ref 0–37)
Albumin: 4.9 g/dL (ref 3.5–5.2)
Alkaline Phosphatase: 65 U/L (ref 39–117)
BUN: 11 mg/dL (ref 6–23)
CO2: 31 mEq/L (ref 19–32)
Calcium: 9.7 mg/dL (ref 8.4–10.5)
Chloride: 100 mEq/L (ref 96–112)
Creatinine, Ser: 1.12 mg/dL (ref 0.40–1.50)
GFR: 84.56 mL/min (ref >60.00)
Glucose, Bld: 84 mg/dL (ref 70–99)
Potassium: 4.4 mEq/L (ref 3.5–5.1)
Sodium: 139 mEq/L (ref 135–145)
Total Bilirubin: 0.6 mg/dL (ref 0.2–1.2)
Total Protein: 7.8 g/dL (ref 6.0–8.3)

## 2021-07-22 LAB — LIPID PANEL
Cholesterol: 189 mg/dL (ref 0–200)
HDL: 61.6 mg/dL (ref >39.00)
LDL Cholesterol: 96 mg/dL (ref 0–99)
NonHDL: 127.81
Total CHOL/HDL Ratio: 3
Triglycerides: 158 mg/dL — ABNORMAL HIGH (ref 0.0–149.0)
VLDL: 31.6 mg/dL (ref 0.0–40.0)

## 2021-07-22 LAB — HEMOGLOBIN A1C: Hgb A1c MFr Bld: 5.9 % (ref 4.6–6.5)

## 2021-07-22 MED ORDER — VALACYCLOVIR HCL 500 MG PO TABS: 500.0000 mg | ORAL_TABLET | Freq: Every day | ORAL | 3 refills | Status: DC

## 2021-07-22 NOTE — Progress Notes (Signed)
Annual Exam   Chief Complaint:  Chief Complaint  Patient presents with   Annual Exam    No concerns     History of Present Illness:  Adam Colon is a 36 y.o. presents today for annual examination.     Nutrition/Lifestyle Diet: feels he is eating less - no change in weight, lower appetite than before Exercise: walking, jogging, swimming, lifting 3-5 times a week He is single partner, contraception - tubal ligation.  Any issues getting or maintaining an erection? - has noticed it is not as hard as before, no loss of hair, no ejaculation disorder  Social History   Tobacco Use  Smoking Status Never  Smokeless Tobacco Never   Social History   Substance and Sexual Activity  Alcohol Use Yes   Comment: 1 glass of wine daily   Social History   Substance and Sexual Activity  Drug Use No     Safety The patient wears seatbelts: yes.     The patient feels safe at home and in their relationships: yes.  General Health Dentist in the last year: No Eye doctor: no  Weight Wt Readings from Last 3 Encounters:  07/22/21 209 lb 8 oz (95 kg)  06/24/20 204 lb 4 oz (92.6 kg)  06/23/20 206 lb 8 oz (93.7 kg)   Patient has high BMI  BMI Readings from Last 1 Encounters:  07/22/21 28.41 kg/m     Chronic disease screening Blood pressure monitoring:  BP Readings from Last 3 Encounters:  07/22/21 118/78  06/24/20 104/64  06/23/20 100/70    Lipid Monitoring: Indication for screening: age >35, obesity, diabetes, family hx, CV risk factors.  Lipid screening: Yes  No results found for: "CHOL", "HDL", "LDLCALC", "LDLDIRECT", "TRIG", "CHOLHDL"   Diabetes Screening: age >25, overweight, family hx, PCOS, hx of gestational diabetes, at risk ethnicity, elevated blood pressure >135/80.  Diabetes Screening screening: Yes  Lab Results  Component Value Date   HGBA1C 5.8 05/05/2020     Immunization History  Administered Date(s) Administered   Tdap 04/25/2012    Past Medical  History:  Diagnosis Date   DDD (degenerative disc disease), cervical    HSV-2 infection    Migraine     Past Surgical History:  Procedure Laterality Date   NO PAST SURGERIES      Prior to Admission medications   Medication Sig Start Date End Date Taking? Authorizing Provider  Ascorbic Acid (VITAMIN C) 100 MG tablet Take 100 mg by mouth daily.   Yes [provider]  Cyanocobalamin (B-12) 2500 MCG TABS Take by mouth.   Yes [provider]  esomeprazole (NEXIUM) 40 MG capsule Take 40 mg by mouth as needed.   Yes [provider]  hydrOXYzine (ATARAX/VISTARIL) 25 MG tablet Take 1 tablet (25 mg total) by mouth every 6 (six) hours as needed for anxiety. 04/30/20  Yes Delton Prairie, MD  Multiple Vitamins-Minerals (CENTRUM SILVER PO) Take by mouth.   Yes [provider]  OVER THE COUNTER MEDICATION Neuro-peak   Yes [provider]  Probiotic Product (PROBIOTIC-10 PO) Take by mouth.   Yes [provider]  valACYclovir (VALTREX) 500 MG tablet TAKE 1 TABLET (500 MG TOTAL) BY MOUTH DAILY. 06/25/21  Yes Lynnda Child, MD    No Known Allergies   Social History   Socioeconomic History   Marital status: Married    Spouse name: Alcario Drought   Number of children: 3   Years of education: Masters   Highest education level:  Not on file  Occupational History   Not on file  Tobacco Use   Smoking status: Never   Smokeless tobacco: Never  Vaping Use   Vaping Use: Never used  Substance and Sexual Activity   Alcohol use: Yes    Comment: 1 glass of wine daily   Drug use: No   Sexual activity: Yes    Birth control/protection: Surgical  Other Topics Concern   Not on file  Social History Narrative   12/26/18   From: [place], outside of [place], came to [place] [facility] for school [employer]   Living: with wife Alcario Drought and 3 children   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 [employer]      Family: 3 girls - [place], Phillis Knack (2009, 2012, 2014)       Enjoys: exercise, bike, yoga, swimming, spend time with friends/family, vacationing      Exercise: took a break with back pain, but back to regular exercise   Diet: mostly fish, veggies, drinks 100 oz      Safety   Seat belts: Yes    Guns: No   Safe in relationships: Yes    Helmet: yes, most of the time   Social Determinants of Corporate investment banker Strain: Not on file  Food Insecurity: Not on file  Transportation Needs: Not on file  Physical Activity: Not on file  Stress: Not on file  Social Connections: Not on file  Intimate Partner Violence: Not on file    Family History  Problem Relation Age of Onset   Depression Mother    Anxiety disorder Mother    ADD / ADHD Father    Bipolar disorder Father    Heart murmur Father    Other Father        TBI, myofasial pain syndrome, trigeminal neuralgia   Learning disabilities Sister    Learning disabilities Brother    Epilepsy Maternal Grandmother    Prostate cancer Paternal Grandfather 93   Diabetes Paternal Grandfather    Heart disease Paternal Grandfather    Diabetes type I Cousin     Review of Systems  Constitutional:  Negative for chills and fever.  HENT:  Negative for congestion and sore throat.   Eyes:  Negative for blurred vision and double vision.  Respiratory:  Negative for shortness of breath.   Cardiovascular:  Negative for chest pain.  Gastrointestinal:  Negative for heartburn, nausea and vomiting.  Genitourinary: Negative.   Musculoskeletal: Negative.  Negative for myalgias.  Skin:  Negative for rash.  Neurological:  Negative for dizziness and headaches.  Endo/Heme/Allergies:  Does not bruise/bleed easily.  Psychiatric/Behavioral:  Negative for depression. The patient is not nervous/anxious.      Physical Exam BP 118/78   Pulse 76   Temp (!) 97.1 F (36.2 C) (Temporal)   Ht 6' (1.829 m)   Wt 209 lb 8 oz (95 kg)   SpO2 97%   BMI 28.41 kg/m    BP Readings from Last 3 Encounters:  07/22/21  118/78  06/24/20 104/64  06/23/20 100/70      Physical Exam Constitutional:      General: He is not in acute distress.    Appearance: He is well-developed. He is not diaphoretic.  HENT:     Head: Normocephalic and atraumatic.     Right Ear: Tympanic membrane and ear canal normal.     Left Ear: Tympanic membrane and ear canal normal.     Nose: Nose normal.  Mouth/Throat:     Pharynx: Uvula midline.  Eyes:     General: No scleral icterus.    Conjunctiva/sclera: Conjunctivae normal.     Pupils: Pupils are equal, round, and reactive to light.  Cardiovascular:     Rate and Rhythm: Normal rate and regular rhythm.     Heart sounds: Normal heart sounds. No murmur heard. Pulmonary:     Effort: Pulmonary effort is normal. No respiratory distress.     Breath sounds: Normal breath sounds. No wheezing.  Abdominal:     General: Bowel sounds are normal. There is no distension.     Palpations: Abdomen is soft. There is no mass.     Tenderness: There is no abdominal tenderness. There is no guarding.  Musculoskeletal:        General: Normal range of motion.     Cervical back: Normal range of motion and neck supple.  Lymphadenopathy:     Cervical: No cervical adenopathy.  Skin:    General: Skin is warm and dry.     Capillary Refill: Capillary refill takes less than 2 seconds.  Neurological:     Mental Status: He is alert and oriented to person, place, and time.        Results:  PHQ-9:  Flowsheet Row Office Visit from 12/26/2018 in [facility]  PHQ-9 Total Score 2         Assessment: 36 y.o. here for routine annual physical examination.  Plan: Problem List Items Addressed This Visit       Other   Hx of herpes genitalis   Relevant Medications   valACYclovir (VALTREX) 500 MG tablet   Prediabetes   Relevant Orders   Hemoglobin A1c   Comprehensive metabolic panel   Other Visit Diagnoses     Annual physical exam    -  Primary   Relevant  Orders   Lipid panel   Comprehensive metabolic panel       Screening: -- Blood pressure screen normal -- cholesterol screening: will obtain -- Weight screening: normal -- Diabetes Screening: will obtain -- Nutrition: Encouraged healthy diet and exercise  The ASCVD Risk score (Arnett DK, et al., 2019) failed to calculate for the following reasons:   The 2019 ASCVD risk score is only valid for ages 44 to 34  -- Statin therapy for Age 53-75 with CVD risk >7.5%  Psych -- Depression screening (PHQ-9):  Flowsheet Row Office Visit from 12/26/2018 in [facility]  PHQ-9 Total Score 2        Safety -- tobacco screening: not using -- alcohol screening:  low-risk usage. -- no evidence of domestic violence or intimate partner violence.   Cancer Screening -- No age related cancer screening due  Immunizations Immunization History  Administered Date(s) Administered   Tdap 04/25/2012    -- flu vaccine not in season -- TDAP q10 years up to date --- Covid-19 Vaccine unknown   Encouraged regular vision and dental screening. Encouraged healthy exercise and diet.   Lynnda Child

## 2021-07-22 NOTE — Patient Instructions (Signed)
Reach out if you want to see the urologist  Continue healthy diet and exercise  Can consider metformin for prediabetes

## 2021-08-28 ENCOUNTER — Encounter: Payer: Self-pay | Admitting: Emergency Medicine

## 2021-08-28 ENCOUNTER — Emergency Department
Admission: EM | Admit: 2021-08-28 | Discharge: 2021-08-28 | Disposition: A | Discharge status: Home / Self Care | Payer: Self-pay | Attending: Emergency Medicine | Admitting: Emergency Medicine

## 2021-08-28 ENCOUNTER — Telehealth: Payer: Self-pay

## 2021-08-28 ENCOUNTER — Other Ambulatory Visit: Payer: Self-pay

## 2021-08-28 DIAGNOSIS — T7840XA Allergy, unspecified, initial encounter: Secondary | ICD-10-CM | POA: Insufficient documentation

## 2021-08-28 MED ORDER — FAMOTIDINE 20 MG PO TABS
20.0000 mg | ORAL_TABLET | Freq: Once | ORAL | Status: AC
  Administered 2021-08-28: 20 mg via ORAL
  Filled 2021-08-28: qty 1

## 2021-08-28 MED ORDER — PREDNISONE 20 MG PO TABS
50.0000 mg | ORAL_TABLET | Freq: Once | ORAL | Status: AC
  Administered 2021-08-28: 50 mg via ORAL
  Filled 2021-08-28: qty 3

## 2021-08-28 MED ORDER — FAMOTIDINE 20 MG PO TABS: 20.0000 mg | ORAL_TABLET | Freq: Every day | ORAL | 0 refills | Status: DC

## 2021-08-28 MED ORDER — PREDNISONE 50 MG PO TABS: ORAL_TABLET | ORAL | 0 refills | Status: DC

## 2021-08-28 NOTE — Telephone Encounter (Signed)
I was going to try to call pt again and per chart review tab pt is at [facility] ED.

## 2021-08-28 NOTE — ED Provider Notes (Signed)
[facility] Provider Note    Event Date/Time   First MD Initiated Contact with Patient 08/28/21 [phone]     (approximate)   History   Allergic Reaction   HPI  Adam Colon is a 36 y.o. male with a past medical history of prediabetes, acid reflux, migraine who presents today for evaluation of allergic reaction.  Patient reports that he was bit by fire ants approximately 90 minutes prior to arrival to his left hand, and was able to wipe them off and he put calamine lotion on the area.  He reports that he then washed his hands, and subsequently took a shower, and when he was in the shower he felt like he had burning to his face.  When he got out of the shower he noticed that he had some periorbital swelling.  He denies tongue or lip swelling.  He denies trouble breathing or swallowing.  He has not noticed any full body rash.  He denies any nausea, vomiting, diarrhea, or abdominal pain.  He has never had anaphylaxis before.  He reports that he was bit by ants before with similar reaction.  He took Benadryl 25 mg prior to arrival.  Patient Active Problem List   Diagnosis Date Noted   Prediabetes 07/22/2021   Rhomboid muscle strain 06/24/2020   Fasciculations of muscle 05/05/2020   Tingling of left upper extremity 05/05/2020   Left-sided headache 05/05/2020   Acid reflux 01/16/2019   Chronic migraine without aura without status migrainosus, not intractable 12/26/2018   Neck pain 12/26/2018   Family history of prostate cancer 12/26/2018   Hx of herpes genitalis 12/26/2018          Physical Exam   Triage Vital Signs: ED Triage Vitals  Enc Vitals Group     BP 08/28/21 0824 (!) 112/52     Pulse Rate 08/28/21 0822 81     Resp 08/28/21 0822 16     Temp 08/28/21 0822 97.9 F (36.6 C)     Temp Source 08/28/21 0822 Oral     SpO2 08/28/21 0822 98 %     Weight 08/28/21 0823 211 lb (95.7 kg)     Height 08/28/21 0823 6\' 1"  (1.854 m)     Head Circumference --       Peak Flow --      Pain Score 08/28/21 0823 0     Pain Loc --      Pain Edu? --      Excl. in GC? --     Most recent vital signs: Vitals:   08/28/21 0822 08/28/21 0824  BP:  (!) 112/52  Pulse: 81   Resp: 16   Temp: 97.9 F (36.6 C)   SpO2: 98%     Physical Exam Vitals and nursing note reviewed.  Constitutional:      General: Awake and alert. No acute distress.    Appearance: Normal appearance. The patient is normal weight.  HENT:     Head: Normocephalic and atraumatic.     Mouth: Mucous membranes are moist.  No tongue or lip swelling.  No sublingual swelling.  No intraoral swelling Eyes:     General: PERRL. Normal EOMs    mild periorbital edema without erythema or ecchymosis.    Right eye: No discharge.        Left eye: No discharge.     Conjunctiva/sclera: Conjunctivae normal.  Cardiovascular:     Rate and Rhythm: Normal rate and regular rhythm.  Pulses: Normal pulses.     Heart sounds: Normal heart sounds Pulmonary:     Effort: Pulmonary effort is normal. No respiratory distress.     Breath sounds: Normal breath sounds.  Able to speak easily in complete sentences, no wheezing Abdominal:     Abdomen is soft. There is no abdominal tenderness. No rebound or guarding. No distention. Musculoskeletal:        General: No swelling. Normal range of motion.     Cervical back: Normal range of motion and neck supple.  Skin:    General: Skin is warm and dry.     Capillary Refill: Capillary refill takes less than 2 seconds.     Findings: No rash.  Neurological:     Mental Status: The patient is awake and alert.      ED Results / Procedures / Treatments   Labs (all labs ordered are listed, but only abnormal results are displayed) Labs Reviewed - No data to display   EKG     RADIOLOGY     PROCEDURES:  Critical Care performed:   Procedures   MEDICATIONS ORDERED IN ED: Medications  famotidine (PEPCID) tablet 20 mg (20 mg Oral Given 08/28/21 0839)   predniSONE (DELTASONE) tablet 50 mg (50 mg Oral Given 08/28/21 0839)     IMPRESSION / MDM / ASSESSMENT AND PLAN / ED COURSE  I reviewed the triage vital signs and the nursing notes.   Differential diagnosis includes, but is not limited to, localized reaction, allergic reaction.  No erythema or warmth or fever to suggest infection.  No tongue or lip swelling to suggest angioedema.  No hemodynamic instability, trouble breathing or swallowing, full body rash, nausea, vomiting, diarrhea, abdominal pain, or multiorgan system involvement to suggest anaphylaxis.  Patient was treated with Pepcid and prednisone given that he had taken Benadryl at home.  Patient was observed for 90 minutes.  He had significant improvement of his symptoms and feels ready for discharge.  Swelling has nearly resolved, itching has resolved.  He was given 3 more days of the medications.  He was reminded that he should not drive, operate heavy machinery, perform any tests or require concentration while taking the Benadryl.  We discussed very strict return precautions for signs of worsening allergic reaction.  Patient understands and agrees with plan as does his significant other.  His significant other requested information for allergy testing.  I explained that I do not think that we have an allergist on the [facility] staff, but sometimes ENT physicians are able to do this and he they were given the ENT information.  Patient and significant other understand and agree with plan.  She was discharged in stable condition.   Patient's presentation is most consistent with acute illness / injury with system symptoms.   Clinical Course as of 08/28/21 1016  Fri Aug 28, 2021  3532 Patient reports that he feels significantly improved and swelling has nearly resolved [JP]    Clinical Course User Index [JP] Braelon Sprung, Herb Grays, PA-C     FINAL CLINICAL IMPRESSION(S) / ED DIAGNOSES   Final diagnoses:  Allergic reaction, initial encounter      Rx / DC Orders   ED Discharge Orders          Ordered    predniSONE (DELTASONE) 50 MG tablet        08/28/21 0949    famotidine (PEPCID) 20 MG tablet  Daily        08/28/21 0949  Note:  This document was prepared using Dragon voice recognition software and may include unintentional dictation errors.   Keturah Shavers 08/28/21 1016    Minna Antis, MD 08/28/21 [phone]

## 2021-08-28 NOTE — Discharge Instructions (Signed)
You may take the 2 prescribed medicines in addition to Benadryl for the next 3 days total.  Please return for any new, worsening, or change in symptoms or other concerns including trouble breathing or swallowing, nausea, vomiting, diarrhea, full body rash, change in your voice, or any other concerns.  It was a pleasure caring for you today.

## 2021-08-28 NOTE — Telephone Encounter (Signed)
Appreciate nursing support. Glad pt was able to get medical care

## 2021-08-28 NOTE — Telephone Encounter (Addendum)
Pt walked in; pt said about 1 hour ago something bit him on the side of hand and arm; thinks may have been ants, not sure; pt said he showered and noticed his face was swelling; no swelling in neck, throat, mouth or tongue and no difficulty in breathing. No available appts at [facility], Dr Selena Batten said she could work pt in but would be later this AM and if pt needs immediate attention can go to [facility] or ED. I asked pt if he was driving and he said yes; I advised we could work pt in but would be later this AM but at least by 10 AM and pt stood up and started walking out of lobby; I asked pt if I could call someone for him to drive and was he going to UC or ED; pt kept walking without response. I tried to call pt x 2 no answer and I did leave message for pt to call our office back please. Sending note to Dr Inda Merlin CMA and will teams [place].

## 2021-08-28 NOTE — ED Notes (Signed)
See triage note  Presents with possible rxn to fire ants   Positive itching and some facial swelling   No resp distress noted

## 2021-08-28 NOTE — ED Triage Notes (Signed)
Pt to ED states that he thinks he was bit by fire ants this morning. Pt states that he was bit on the left hand, pt states that he washed the area and applied cream to it. Pt states that afterwards he started having itching, facial swelling, and feeling like his skin is hot. Pt is in NAD.

## 2021-09-10 DIAGNOSIS — Z6827 Body mass index (BMI) 27.0-27.9, adult: Secondary | ICD-10-CM | POA: Diagnosis not present

## 2021-09-10 DIAGNOSIS — J020 Streptococcal pharyngitis: Secondary | ICD-10-CM | POA: Diagnosis not present

## 2021-09-28 DIAGNOSIS — F4389 Other reactions to severe stress: Secondary | ICD-10-CM | POA: Diagnosis not present

## 2021-09-28 DIAGNOSIS — F4320 Adjustment disorder, unspecified: Secondary | ICD-10-CM | POA: Diagnosis not present

## 2021-10-23 DIAGNOSIS — F4320 Adjustment disorder, unspecified: Secondary | ICD-10-CM | POA: Diagnosis not present

## 2021-11-03 DIAGNOSIS — F4320 Adjustment disorder, unspecified: Secondary | ICD-10-CM | POA: Diagnosis not present

## 2022-02-17 ENCOUNTER — Telehealth: Payer: Self-pay | Admitting: Family Medicine

## 2022-02-17 IMAGING — CT CT HEAD W/O CM
3 series · 16 of 46 positions shown, 19 images · non-contrast
Comparison: August 20, 2006

CLINICAL DATA: Left-sided numbness and tingling

EXAM:
CT HEAD WITHOUT CONTRAST
TECHNIQUE: Contiguous axial images were obtained from the base of the skull
through the vertex without intravenous contrast.

[Series 2: head wo · axial · 0.42mm/px · z∈[+347,+467]mm · 10 of 29 slices shown, 13 images]
[im 3/29  brain]
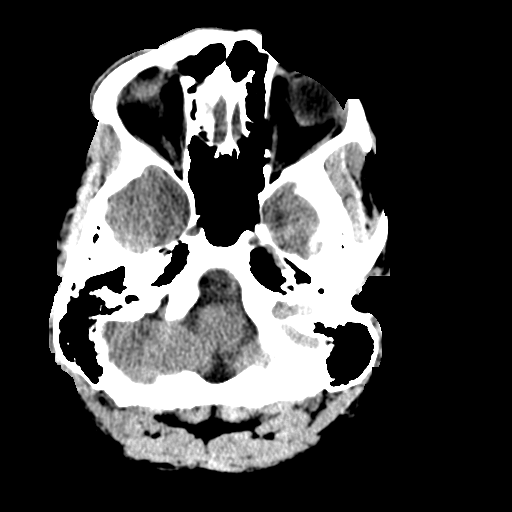
[im 3/29  bone]
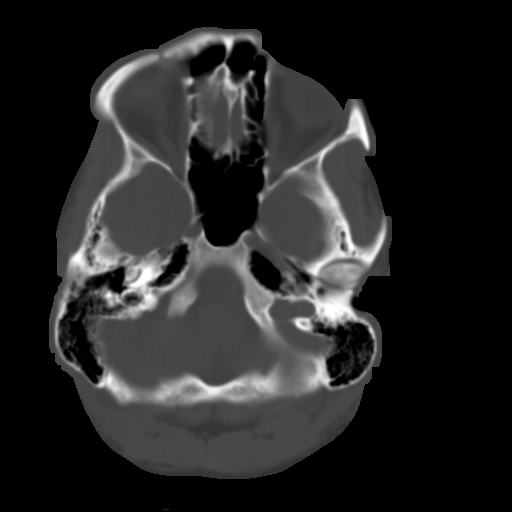
[im 6/29  brain]
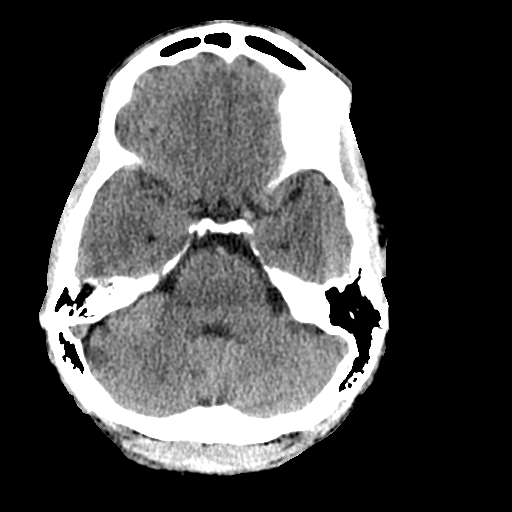
[im 8/29  brain]
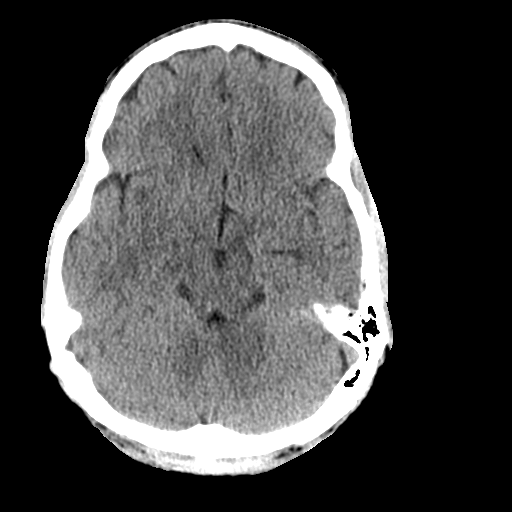
[im 11/29  brain]
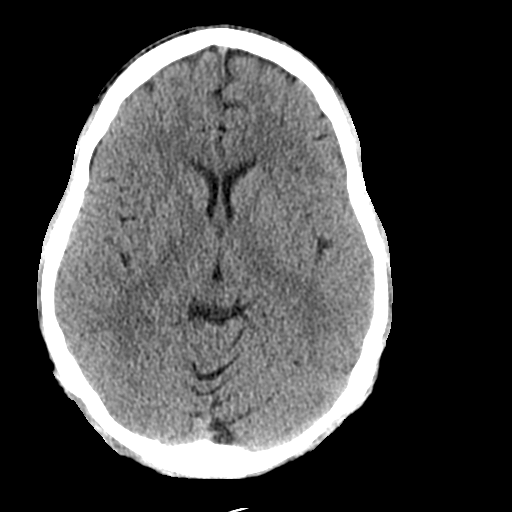
[im 14/29  brain]
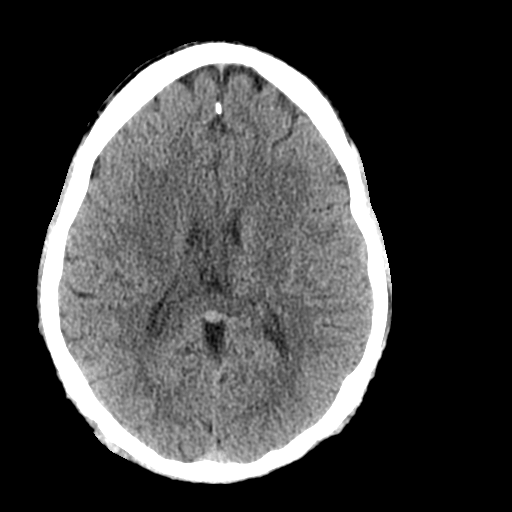
[im 14/29  bone]
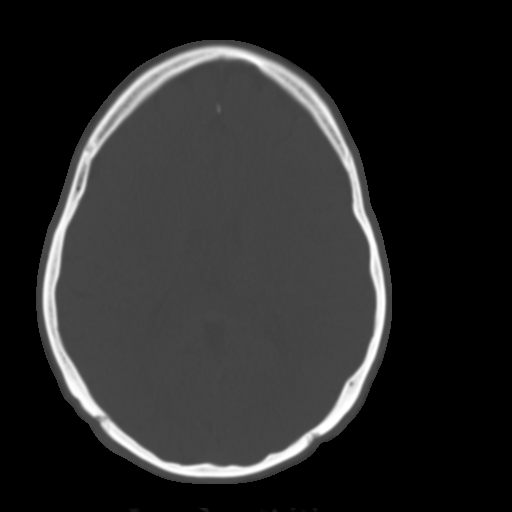
[im 16/29  brain]
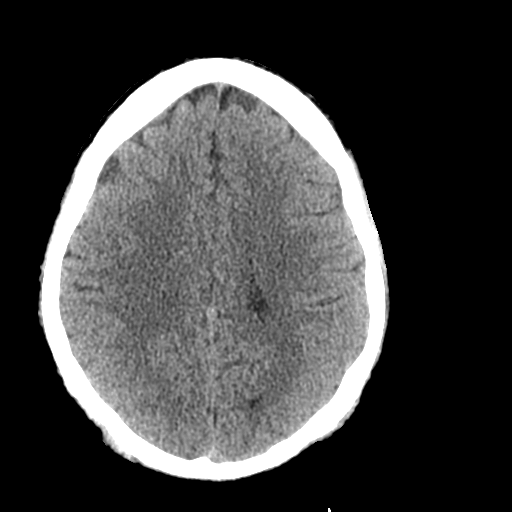
[im 19/29  brain]
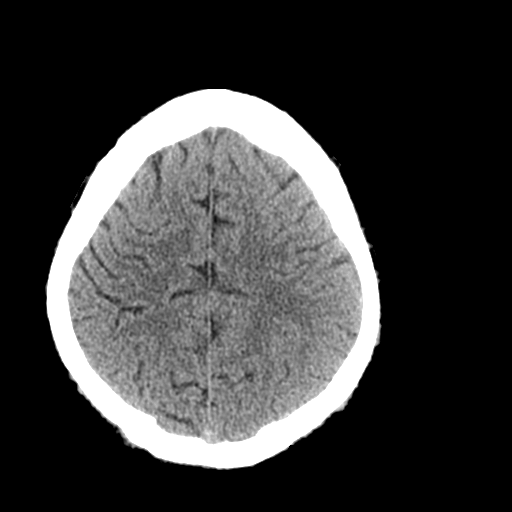
[im 22/29  brain]
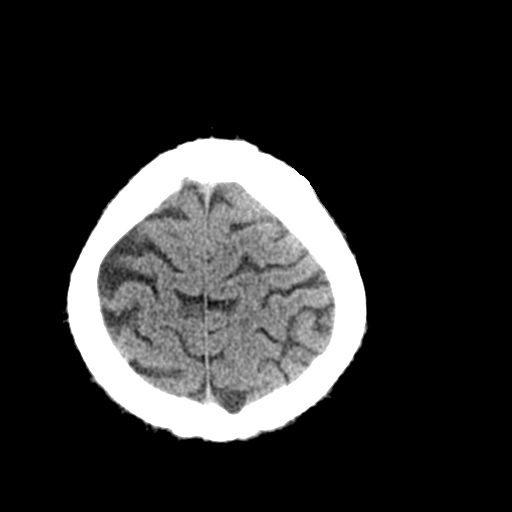
[im 24/29  brain]
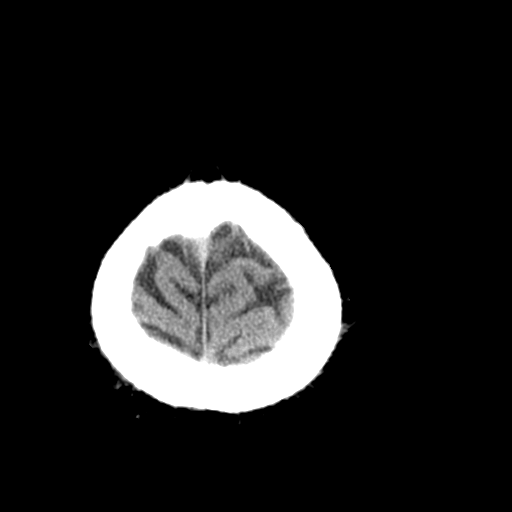
[im 24/29  bone]
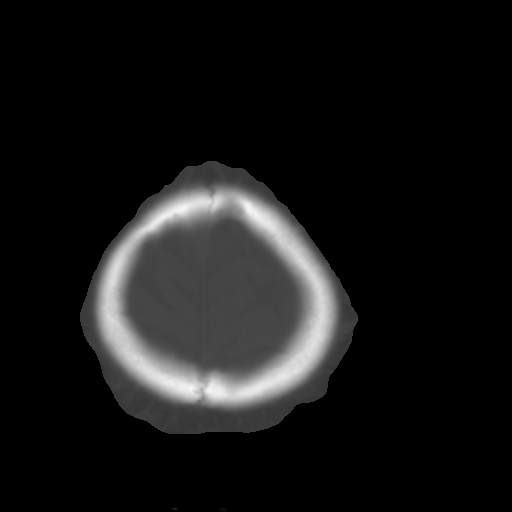
[im 27/29  brain]
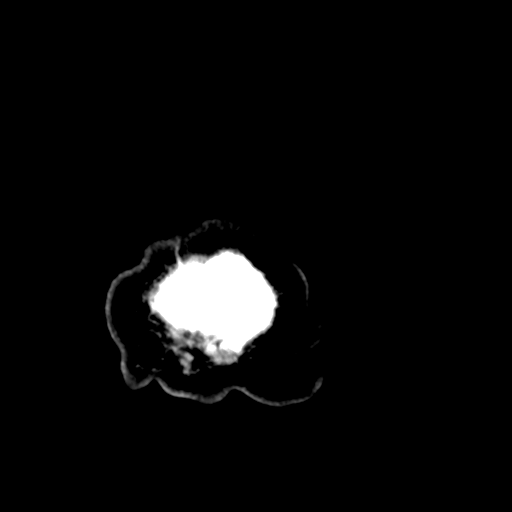

[Series 4: coronal soft tissue · coronal · 0.35mm/px · 3 of 65 slices shown]
[im 22/65  brain]
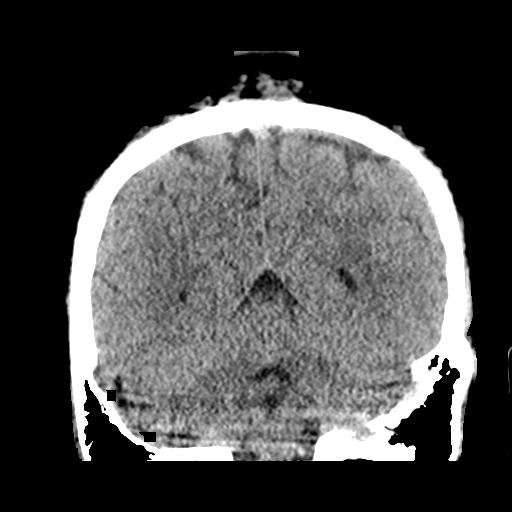
[im 29/65  brain]
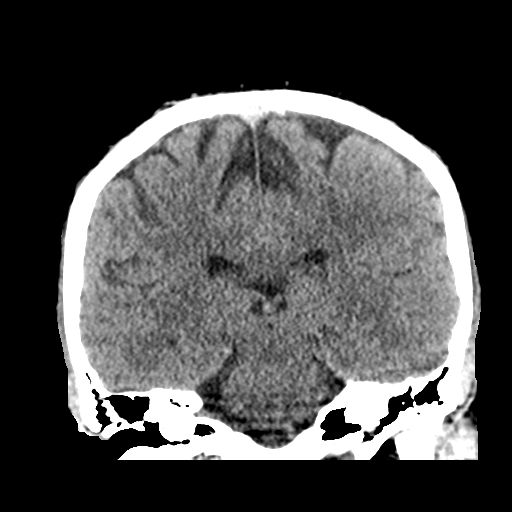
[im 36/65  brain]
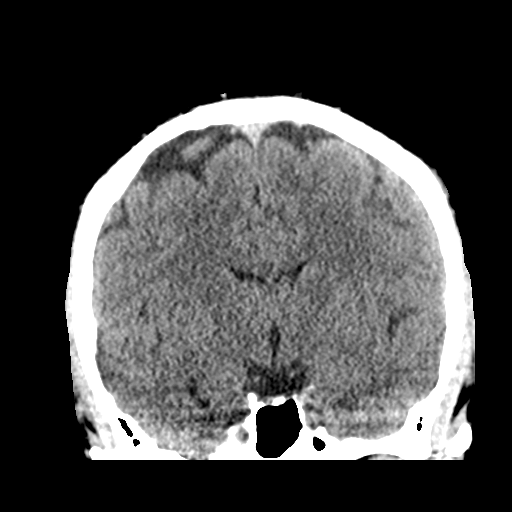

[Series 5: sagittal soft tissue · sagittal · 0.33mm/px · 3 of 57 slices shown]
[im 19/57  brain]
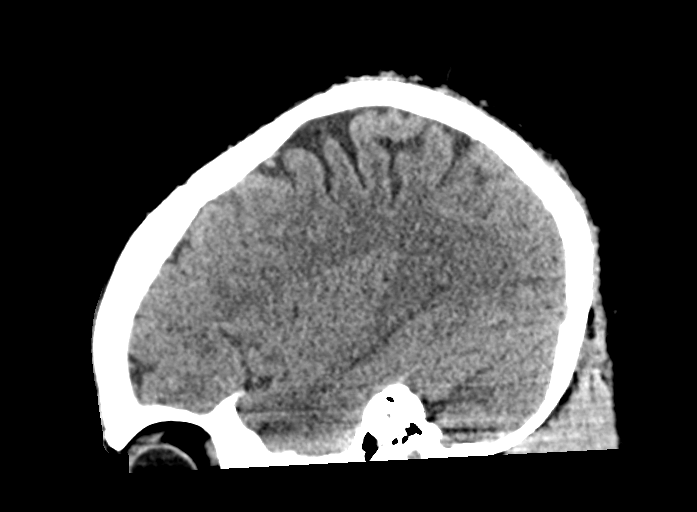
[im 29/57  brain]
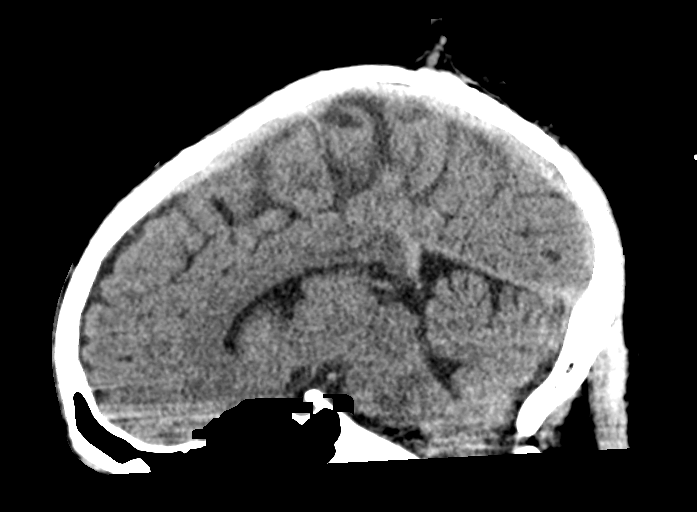
[im 38/57  brain]
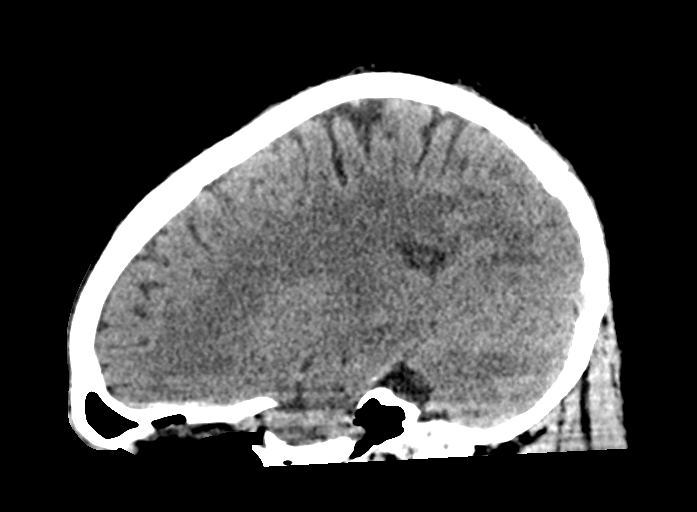

[16 of 46 positions shown; findings below may reference images not displayed]

FINDINGS: Brain: No evidence of acute infarction, hemorrhage, hydrocephalus,
extra-axial collection or mass lesion/mass effect.

Vascular: No hyperdense vessel or unexpected calcification.

Skull: Normal. Negative for fracture or focal lesion.

Sinuses/Orbits: Paranasal sinuses and mastoid air cells are
predominantly clear.

Other: None
IMPRESSION: No acute intracranial findings.

## 2022-02-17 NOTE — Telephone Encounter (Signed)
Patient is scheduled for a TOC with Tabitha Dugal in March. He is needing an accommodation for work regarding his panic attacks. Please advise. Thank you!

## 2022-02-17 NOTE — Telephone Encounter (Signed)
Called and scheduled patient appt to discuss accomadatoins for anxiety.

## 2022-02-19 DIAGNOSIS — F4389 Other reactions to severe stress: Secondary | ICD-10-CM | POA: Diagnosis not present

## 2022-02-23 ENCOUNTER — Ambulatory Visit (INDEPENDENT_AMBULATORY_CARE_PROVIDER_SITE_OTHER): Payer: BC Managed Care – PPO | Admitting: Family

## 2022-02-23 ENCOUNTER — Encounter: Payer: Self-pay | Admitting: Family

## 2022-02-23 VITALS — BP 130/76 | HR 98 | Temp 97.8°F | Ht 73.0 in | Wt 212.2 lb

## 2022-02-23 DIAGNOSIS — Z87898 Personal history of other specified conditions: Secondary | ICD-10-CM | POA: Insufficient documentation

## 2022-02-23 NOTE — Assessment & Plan Note (Signed)
EKG today, slightly abn with ST elevation, suspected LVH, will obtain echo and r/o other concerns.  Referral placed for cardiology as this is to be cleared for a DOT physical however since >6 months since syncopal episode x 1 suspected to be r/t pt not eating that am, suspect pt could easily be cleared
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
## 2022-02-23 NOTE — Patient Instructions (Signed)
  Consider EMDR or brain spotting with your therapist and or get recommendation for one.    Regards,   Eugenia Pancoast FNP-C

## 2022-03-03 DIAGNOSIS — F4310 Post-traumatic stress disorder, unspecified: Secondary | ICD-10-CM | POA: Diagnosis not present

## 2022-03-03 DIAGNOSIS — Z630 Problems in relationship with spouse or partner: Secondary | ICD-10-CM | POA: Diagnosis not present

## 2022-03-08 ENCOUNTER — Encounter: Payer: BC Managed Care – PPO | Admitting: Family

## 2022-03-09 DIAGNOSIS — Z630 Problems in relationship with spouse or partner: Secondary | ICD-10-CM | POA: Diagnosis not present

## 2022-03-09 DIAGNOSIS — F4310 Post-traumatic stress disorder, unspecified: Secondary | ICD-10-CM | POA: Diagnosis not present

## 2022-03-16 DIAGNOSIS — F4310 Post-traumatic stress disorder, unspecified: Secondary | ICD-10-CM | POA: Diagnosis not present

## 2022-03-16 DIAGNOSIS — Z630 Problems in relationship with spouse or partner: Secondary | ICD-10-CM | POA: Diagnosis not present

## 2022-03-23 DIAGNOSIS — Z630 Problems in relationship with spouse or partner: Secondary | ICD-10-CM | POA: Diagnosis not present

## 2022-03-23 DIAGNOSIS — F4310 Post-traumatic stress disorder, unspecified: Secondary | ICD-10-CM | POA: Diagnosis not present

## 2022-03-24 ENCOUNTER — Telehealth: Payer: Self-pay | Admitting: Family

## 2022-03-24 NOTE — Telephone Encounter (Signed)
Received form and it has been placed into Adam Colon's box in her office.

## 2022-03-24 NOTE — Telephone Encounter (Signed)
Type of forms received: employment ppw   Routed to: [employer]  Paperwork received by : Armandina Stammer    Individual made aware of 3-5 business day turn around (Y/N):  Form completed and patient made aware of charges(Y/N): Y   Faxed to : pt requested call back once ppw is comp @ [phone]  Form location:  dugal's folder

## 2022-03-30 DIAGNOSIS — Z630 Problems in relationship with spouse or partner: Secondary | ICD-10-CM | POA: Diagnosis not present

## 2022-03-30 DIAGNOSIS — F4310 Post-traumatic stress disorder, unspecified: Secondary | ICD-10-CM | POA: Diagnosis not present

## 2022-03-30 NOTE — Telephone Encounter (Signed)
Spoke with patient and he will pick up the forms. He prefers our office not to fax the forms in.

## 2022-03-30 NOTE — Telephone Encounter (Signed)
Form completed and in outbox.  Can we please fax to requested # and inform pt faxed and completed? thanks

## 2022-04-06 DIAGNOSIS — Z630 Problems in relationship with spouse or partner: Secondary | ICD-10-CM | POA: Diagnosis not present

## 2022-04-06 DIAGNOSIS — F4310 Post-traumatic stress disorder, unspecified: Secondary | ICD-10-CM | POA: Diagnosis not present

## 2022-04-13 ENCOUNTER — Encounter: Payer: Self-pay | Admitting: Family

## 2022-04-14 NOTE — Telephone Encounter (Signed)
I have not received any additional communication from his employer.

## 2022-04-14 NOTE — Telephone Encounter (Signed)
Patient called in and wanted to discuss with one of you further about his accommodation. He can be reached at [phone]. Thank you!

## 2022-06-02 ENCOUNTER — Other Ambulatory Visit: Payer: Self-pay

## 2022-06-02 DIAGNOSIS — Z8619 Personal history of other infectious and parasitic diseases: Secondary | ICD-10-CM

## 2022-06-02 NOTE — Telephone Encounter (Signed)
Refill   valACYclovir (VALTREX) 500 MG tablet   LR- 07/22/21 ( 90 tabs/ 3 refills)- Dr Selena Batten LV-02/23/22 [place]- Not Scheduled

## 2022-06-03 ENCOUNTER — Encounter: Payer: Self-pay | Admitting: Family

## 2022-06-03 MED ORDER — VALACYCLOVIR HCL 500 MG PO TABS: 500.0000 mg | ORAL_TABLET | Freq: Every day | ORAL | 3 refills | Status: DC

## 2022-09-07 DIAGNOSIS — R051 Acute cough: Secondary | ICD-10-CM | POA: Diagnosis not present

## 2022-09-07 DIAGNOSIS — J0140 Acute pansinusitis, unspecified: Secondary | ICD-10-CM | POA: Diagnosis not present

## 2022-09-07 DIAGNOSIS — Z6828 Body mass index (BMI) 28.0-28.9, adult: Secondary | ICD-10-CM | POA: Diagnosis not present

## 2022-09-07 DIAGNOSIS — R0981 Nasal congestion: Secondary | ICD-10-CM | POA: Diagnosis not present

## 2023-01-16 ENCOUNTER — Encounter: Payer: Self-pay | Admitting: Family

## 2023-01-16 DIAGNOSIS — Z20828 Contact with and (suspected) exposure to other viral communicable diseases: Secondary | ICD-10-CM

## 2023-01-16 DIAGNOSIS — Z8619 Personal history of other infectious and parasitic diseases: Secondary | ICD-10-CM

## 2023-01-17 MED ORDER — VALACYCLOVIR HCL 500 MG PO TABS: 500.0000 mg | ORAL_TABLET | Freq: Every day | ORAL | 3 refills | Status: DC

## 2023-02-04 DIAGNOSIS — Z6827 Body mass index (BMI) 27.0-27.9, adult: Secondary | ICD-10-CM | POA: Diagnosis not present

## 2023-02-04 DIAGNOSIS — Z20828 Contact with and (suspected) exposure to other viral communicable diseases: Secondary | ICD-10-CM | POA: Diagnosis not present

## 2023-02-04 DIAGNOSIS — Z1152 Encounter for screening for COVID-19: Secondary | ICD-10-CM | POA: Diagnosis not present

## 2023-02-04 DIAGNOSIS — J029 Acute pharyngitis, unspecified: Secondary | ICD-10-CM | POA: Diagnosis not present

## 2023-02-06 MED ORDER — OSELTAMIVIR PHOSPHATE 75 MG PO CAPS: 75.0000 mg | ORAL_CAPSULE | Freq: Two times a day (BID) | ORAL | 0 refills | Status: DC

## 2023-02-06 NOTE — Addendum Note (Signed)
Addended by: Mort Sawyers on: 02/06/2023 07:14 PM   Modules accepted: Orders

## 2023-02-07 ENCOUNTER — Encounter: Payer: Self-pay | Admitting: Family

## 2023-02-07 ENCOUNTER — Ambulatory Visit: Payer: Self-pay | Admitting: Family

## 2023-02-07 ENCOUNTER — Ambulatory Visit (INDEPENDENT_AMBULATORY_CARE_PROVIDER_SITE_OTHER): Payer: BC Managed Care – PPO | Admitting: Family

## 2023-02-07 VITALS — BP 120/76 | HR 98 | Temp 98.9°F | Ht 73.0 in | Wt 219.6 lb

## 2023-02-07 DIAGNOSIS — J039 Acute tonsillitis, unspecified: Secondary | ICD-10-CM | POA: Diagnosis not present

## 2023-02-07 MED ORDER — AMOXICILLIN-POT CLAVULANATE 875-125 MG PO TABS: 1.0000 | ORAL_TABLET | Freq: Two times a day (BID) | ORAL | 0 refills | Status: AC

## 2023-02-07 NOTE — Telephone Encounter (Signed)
  Chief Complaint: influenza exposure Symptoms: chills, cough, congestion, chest sore after deep coughing Frequency: since Wednesday (02/02/23) Pertinent Negatives: Patient denies sore throat, earaches Disposition: [] ED /[] Urgent Care (no appt availability in office) / [x] Appointment(In office/virtual)/ []  [facility]/ [] Home Care/ [] Refused Recommended Disposition /[] [facility]/ []  Follow-up with PCP Additional Notes: Patient requesting to come in today if he can be seen by his PCP, he would like a second opinion. He was seen at urgent care on Friday and tested negative for the flu. Patient has close contact with his wife who tested positive for the flu last Thursday. Patient felt he was dismissed by the urgent care and the provider did not address his concerns.   Copied from CRM [id number]. Topic: Clinical - Red Word Triage >> Feb 07, 2023  8:41 AM Adele Barthel wrote: Red Word that prompted transfer to Nurse Triage: Patient started having noticeable symptoms on Wed. Was seen at urgent care Friday and tested negative for flu and strep, but wife was diagnosed with flu last Thurs. Woke up this mroning with chills, chest pain, chest congestion, severe cough Reason for Disposition  [1] Probable influenza (fever) with no complications AND [2] NOT HIGH RISK  Answer Assessment - Initial Assessment Questions 1. TYPE of EXPOSURE: "How were you exposed?" (e.g., close contact, not a close contact)     Close contact.  2. DATE of EXPOSURE: "When did the exposure occur?" (e.g., hour, days, weeks)     Wife tested positive on 02/03/23.  3. PREGNANCY: "Is there any chance you are pregnant?" "When was your last menstrual period?"     N/A.  4. HIGH RISK for COMPLICATIONS: "Do you have any heart or lung problems?" "Do you have a weakened immune system?" (e.g., CHF, COPD, asthma, HIV positive, chemotherapy, renal failure, diabetes mellitus, sickle cell anemia)     Denies.  5. SYMPTOMS: "Do you  have any symptoms?" (e.g., cough, fever, sore throat, difficulty breathing).     Chills, cough, congestion, chest sore after deep cough  Protocols used: Influenza Exposure-A-AH, Influenza - Seasonal-A-AH

## 2023-02-07 NOTE — Assessment & Plan Note (Signed)
Take antibiotic as prescribed. Increase oral fluids. Pt to f/u if sx worsen and or fail to improve in 2-3 days. rx augmentin 875/125 mg po bid x 10 days  

## 2023-02-07 NOTE — Telephone Encounter (Signed)
Noted will see pt as scheduled

## 2023-02-07 NOTE — Progress Notes (Signed)
Established Patient Office Visit  Subjective:   Patient ID: Adam Colon, male    DOB: 1985/02/08  Age: 38 y.o. MRN: [id number]  CC:  Chief Complaint  Patient presents with   Acute Visit    Reports cough, sore throat, sinus congestion, fatigue. Denies fever, body aches, chills. Wife tested positive for flu on 02/03/2023.    HPI: Adam Colon is a 38 y.o. male presenting on 02/07/2023 for Acute Visit (Reports cough, sore throat, sinus congestion, fatigue. Denies fever, body aches, chills. Wife tested positive for flu on 02/03/2023.)  Six days ago started with nasal congestion. Has since progressed.  Had chills this am while lying in bed.  He does state he has sore throat and is coughing, chest congestion also with runny nose. On day six and states not getting better. Cough is productive, sputum is yellowish. No fever on temp checking.   Went to urgent care three days ago for positive flu exposure as his wife was diagnosed with flu. Was also tested for strep and covid.   Was given tamiflu but didn't take the medication.  Taking mucinex with mild relief and delsym. Ibuprofen at times as well  Some DOE.   Pt is concerned because at work he does note some change in how he is being treated since submitting accomodation for work. He will make a f/u visit to discuss this further. Last discussed 04/2022. 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     ROS: Negative unless specifically indicated above in HPI.   Relevant past medical history reviewed and updated as indicated.   Allergies and medications reviewed and updated.   Current Outpatient Medications:    amoxicillin-clavulanate (AUGMENTIN) 875-125 MG tablet, Take 1 tablet by mouth 2 (two) times daily., Disp: 20 tablet, Rfl: 0   Multiple Vitamins-Minerals (CENTRUM SILVER PO), Take by mouth., Disp: , Rfl:    valACYclovir (VALTREX) 500 MG tablet, Take 1 tablet (500 mg  total) by mouth daily., Disp: 90 tablet, Rfl: 3  Allergies  Allergen Reactions   Hydroxyzine Other (See Comments)    Dizziness unstable gait blurry vision     Objective:   BP 120/76 (BP Location: Right Arm, Patient Position: Sitting, Cuff Size: Large)   Pulse 98   Temp 98.9 F (37.2 C) (Oral)   Ht 6\' 1"  (1.854 m)   Wt 219 lb 9.6 oz (99.6 kg)   SpO2 99%   BMI 28.97 kg/m    Physical Exam Vitals reviewed.  Constitutional:      General: He is not in acute distress.    Appearance: Normal appearance. He is obese. He is not ill-appearing, toxic-appearing or diaphoretic.  HENT:     Head: Normocephalic.     Right Ear: Tympanic membrane normal.     Left Ear: Tympanic membrane normal.     Nose: Nose normal.     Mouth/Throat:     Mouth: Mucous membranes are moist.  Eyes:     Pupils: Pupils are equal, round, and reactive to light.  Cardiovascular:     Rate and Rhythm: Normal rate and regular rhythm.  Pulmonary:     Effort: Pulmonary effort is normal.     Breath sounds: Normal breath sounds. No wheezing.  Musculoskeletal:        General: Normal range of motion.     Cervical back: Normal range of motion.  Neurological:     General: No focal  deficit present.     Mental Status: He is alert and oriented to person, place, and time. Mental status is at baseline.  Psychiatric:        Mood and Affect: Mood normal.        Behavior: Behavior normal.        Thought Content: Thought content normal.        Judgment: Judgment normal.     Assessment & Plan:  Acute tonsillitis, unspecified etiology Assessment & Plan: Take antibiotic as prescribed. Increase oral fluids. Pt to f/u if sx worsen and or fail to improve in 2-3 days.  rx augmentin 875/125 mg po bid x 10 days   Orders: -     Amoxicillin-Pot Clavulanate; Take 1 tablet by mouth 2 (two) times daily.  Dispense: 20 tablet; Refill: 0     Follow up plan: Return if symptoms worsen or fail to improve.  Mort Sawyers, FNP

## 2023-07-08 ENCOUNTER — Encounter: Payer: Self-pay | Admitting: Family

## 2023-07-08 DIAGNOSIS — Z8619 Personal history of other infectious and parasitic diseases: Secondary | ICD-10-CM

## 2023-07-11 MED ORDER — VALACYCLOVIR HCL 500 MG PO TABS: 500.0000 mg | ORAL_TABLET | Freq: Every day | ORAL | 3 refills | Status: DC

## 2023-07-11 MED ORDER — VALACYCLOVIR HCL 1 G PO TABS: ORAL_TABLET | ORAL | 2 refills | Status: AC

## 2023-11-11 ENCOUNTER — Encounter: Payer: Self-pay | Admitting: Family
